# Patient Record
Sex: Male | Born: 1950 | Race: White | Hispanic: No | Marital: Married | Smoking: Never smoker
Health system: Southern US, Community
[De-identification: ages and names within clinical notes are randomized; demographics above are authoritative.]

## PROBLEM LIST (undated history)

## (undated) DIAGNOSIS — E119 Type 2 diabetes mellitus without complications: Secondary | ICD-10-CM

## (undated) HISTORY — PX: CORONARY ANGIOPLASTY: SHX604

## (undated) HISTORY — PX: COLONOSCOPY: SHX174

---

## 2008-12-30 ENCOUNTER — Ambulatory Visit: Payer: Self-pay | Admitting: Gastroenterology

## 2015-03-22 DIAGNOSIS — Z125 Encounter for screening for malignant neoplasm of prostate: Secondary | ICD-10-CM | POA: Diagnosis not present

## 2015-03-22 DIAGNOSIS — Z79899 Other long term (current) drug therapy: Secondary | ICD-10-CM | POA: Diagnosis not present

## 2015-03-22 DIAGNOSIS — I10 Essential (primary) hypertension: Secondary | ICD-10-CM | POA: Diagnosis not present

## 2015-03-22 DIAGNOSIS — E780 Pure hypercholesterolemia: Secondary | ICD-10-CM | POA: Diagnosis not present

## 2015-03-22 DIAGNOSIS — E119 Type 2 diabetes mellitus without complications: Secondary | ICD-10-CM | POA: Diagnosis not present

## 2015-03-29 DIAGNOSIS — E119 Type 2 diabetes mellitus without complications: Secondary | ICD-10-CM | POA: Diagnosis not present

## 2015-03-29 DIAGNOSIS — R609 Edema, unspecified: Secondary | ICD-10-CM | POA: Diagnosis not present

## 2015-03-29 DIAGNOSIS — Z0000 Encounter for general adult medical examination without abnormal findings: Secondary | ICD-10-CM | POA: Diagnosis not present

## 2015-03-29 DIAGNOSIS — E780 Pure hypercholesterolemia: Secondary | ICD-10-CM | POA: Diagnosis not present

## 2015-05-15 DIAGNOSIS — Z024 Encounter for examination for driving license: Secondary | ICD-10-CM | POA: Diagnosis not present

## 2015-09-21 DIAGNOSIS — Z79899 Other long term (current) drug therapy: Secondary | ICD-10-CM | POA: Diagnosis not present

## 2015-09-21 DIAGNOSIS — E7800 Pure hypercholesterolemia, unspecified: Secondary | ICD-10-CM | POA: Diagnosis not present

## 2015-09-21 DIAGNOSIS — E119 Type 2 diabetes mellitus without complications: Secondary | ICD-10-CM | POA: Diagnosis not present

## 2015-10-10 DIAGNOSIS — Z125 Encounter for screening for malignant neoplasm of prostate: Secondary | ICD-10-CM | POA: Diagnosis not present

## 2015-10-10 DIAGNOSIS — Z79899 Other long term (current) drug therapy: Secondary | ICD-10-CM | POA: Diagnosis not present

## 2015-10-10 DIAGNOSIS — E7800 Pure hypercholesterolemia, unspecified: Secondary | ICD-10-CM | POA: Diagnosis not present

## 2015-10-10 DIAGNOSIS — E119 Type 2 diabetes mellitus without complications: Secondary | ICD-10-CM | POA: Diagnosis not present

## 2016-03-29 DIAGNOSIS — Z125 Encounter for screening for malignant neoplasm of prostate: Secondary | ICD-10-CM | POA: Diagnosis not present

## 2016-03-29 DIAGNOSIS — E119 Type 2 diabetes mellitus without complications: Secondary | ICD-10-CM | POA: Diagnosis not present

## 2016-03-29 DIAGNOSIS — Z79899 Other long term (current) drug therapy: Secondary | ICD-10-CM | POA: Diagnosis not present

## 2016-03-29 DIAGNOSIS — E7800 Pure hypercholesterolemia, unspecified: Secondary | ICD-10-CM | POA: Diagnosis not present

## 2016-04-16 DIAGNOSIS — E1165 Type 2 diabetes mellitus with hyperglycemia: Secondary | ICD-10-CM | POA: Diagnosis not present

## 2016-04-16 DIAGNOSIS — Z79899 Other long term (current) drug therapy: Secondary | ICD-10-CM | POA: Diagnosis not present

## 2016-04-16 DIAGNOSIS — E7800 Pure hypercholesterolemia, unspecified: Secondary | ICD-10-CM | POA: Diagnosis not present

## 2016-04-16 DIAGNOSIS — E119 Type 2 diabetes mellitus without complications: Secondary | ICD-10-CM | POA: Diagnosis not present

## 2016-04-16 DIAGNOSIS — Z0000 Encounter for general adult medical examination without abnormal findings: Secondary | ICD-10-CM | POA: Diagnosis not present

## 2016-10-10 DIAGNOSIS — E7800 Pure hypercholesterolemia, unspecified: Secondary | ICD-10-CM | POA: Diagnosis not present

## 2016-10-10 DIAGNOSIS — Z79899 Other long term (current) drug therapy: Secondary | ICD-10-CM | POA: Diagnosis not present

## 2016-10-10 DIAGNOSIS — E119 Type 2 diabetes mellitus without complications: Secondary | ICD-10-CM | POA: Diagnosis not present

## 2016-10-23 DIAGNOSIS — J069 Acute upper respiratory infection, unspecified: Secondary | ICD-10-CM | POA: Diagnosis not present

## 2016-10-23 DIAGNOSIS — E119 Type 2 diabetes mellitus without complications: Secondary | ICD-10-CM | POA: Diagnosis not present

## 2016-10-23 DIAGNOSIS — E7800 Pure hypercholesterolemia, unspecified: Secondary | ICD-10-CM | POA: Diagnosis not present

## 2016-10-23 DIAGNOSIS — Z125 Encounter for screening for malignant neoplasm of prostate: Secondary | ICD-10-CM | POA: Diagnosis not present

## 2016-10-23 DIAGNOSIS — Z79899 Other long term (current) drug therapy: Secondary | ICD-10-CM | POA: Diagnosis not present

## 2017-04-16 DIAGNOSIS — E7800 Pure hypercholesterolemia, unspecified: Secondary | ICD-10-CM | POA: Diagnosis not present

## 2017-04-16 DIAGNOSIS — Z79899 Other long term (current) drug therapy: Secondary | ICD-10-CM | POA: Diagnosis not present

## 2017-04-16 DIAGNOSIS — Z125 Encounter for screening for malignant neoplasm of prostate: Secondary | ICD-10-CM | POA: Diagnosis not present

## 2017-04-16 DIAGNOSIS — E119 Type 2 diabetes mellitus without complications: Secondary | ICD-10-CM | POA: Diagnosis not present

## 2017-05-16 DIAGNOSIS — Z0000 Encounter for general adult medical examination without abnormal findings: Secondary | ICD-10-CM | POA: Diagnosis not present

## 2017-05-16 DIAGNOSIS — Z79899 Other long term (current) drug therapy: Secondary | ICD-10-CM | POA: Diagnosis not present

## 2017-05-16 DIAGNOSIS — E7800 Pure hypercholesterolemia, unspecified: Secondary | ICD-10-CM | POA: Diagnosis not present

## 2017-05-16 DIAGNOSIS — E1165 Type 2 diabetes mellitus with hyperglycemia: Secondary | ICD-10-CM | POA: Diagnosis not present

## 2017-05-16 DIAGNOSIS — E119 Type 2 diabetes mellitus without complications: Secondary | ICD-10-CM | POA: Diagnosis not present

## 2017-06-01 DIAGNOSIS — R079 Chest pain, unspecified: Secondary | ICD-10-CM | POA: Diagnosis not present

## 2017-06-03 DIAGNOSIS — R0789 Other chest pain: Secondary | ICD-10-CM | POA: Diagnosis not present

## 2017-06-03 DIAGNOSIS — K219 Gastro-esophageal reflux disease without esophagitis: Secondary | ICD-10-CM | POA: Diagnosis not present

## 2017-06-09 ENCOUNTER — Observation Stay
Admission: EM | Admit: 2017-06-09 | Discharge: 2017-06-10 | Disposition: A | Discharge status: Home / Self Care | Payer: Medicare HMO | Attending: Internal Medicine | Admitting: Internal Medicine

## 2017-06-09 ENCOUNTER — Emergency Department: Payer: Medicare HMO

## 2017-06-09 ENCOUNTER — Encounter: Payer: Self-pay | Admitting: Emergency Medicine

## 2017-06-09 ENCOUNTER — Encounter
Admission: EM | Disposition: A | Discharge status: Home / Self Care | Payer: Self-pay | Source: Home / Self Care | Attending: Emergency Medicine

## 2017-06-09 DIAGNOSIS — E119 Type 2 diabetes mellitus without complications: Secondary | ICD-10-CM | POA: Diagnosis not present

## 2017-06-09 DIAGNOSIS — R9439 Abnormal result of other cardiovascular function study: Secondary | ICD-10-CM | POA: Diagnosis not present

## 2017-06-09 DIAGNOSIS — I2510 Atherosclerotic heart disease of native coronary artery without angina pectoris: Principal | ICD-10-CM | POA: Insufficient documentation

## 2017-06-09 DIAGNOSIS — I25110 Atherosclerotic heart disease of native coronary artery with unstable angina pectoris: Secondary | ICD-10-CM | POA: Diagnosis not present

## 2017-06-09 DIAGNOSIS — Z7984 Long term (current) use of oral hypoglycemic drugs: Secondary | ICD-10-CM | POA: Diagnosis not present

## 2017-06-09 DIAGNOSIS — Z7982 Long term (current) use of aspirin: Secondary | ICD-10-CM | POA: Insufficient documentation

## 2017-06-09 DIAGNOSIS — Z79899 Other long term (current) drug therapy: Secondary | ICD-10-CM | POA: Insufficient documentation

## 2017-06-09 DIAGNOSIS — E7800 Pure hypercholesterolemia, unspecified: Secondary | ICD-10-CM | POA: Diagnosis not present

## 2017-06-09 DIAGNOSIS — R0789 Other chest pain: Secondary | ICD-10-CM

## 2017-06-09 DIAGNOSIS — R9430 Abnormal result of cardiovascular function study, unspecified: Secondary | ICD-10-CM | POA: Diagnosis not present

## 2017-06-09 DIAGNOSIS — R079 Chest pain, unspecified: Secondary | ICD-10-CM | POA: Diagnosis not present

## 2017-06-09 HISTORY — PX: CORONARY STENT INTERVENTION: CATH118234

## 2017-06-09 HISTORY — PX: LEFT HEART CATH AND CORONARY ANGIOGRAPHY: CATH118249

## 2017-06-09 HISTORY — DX: Type 2 diabetes mellitus without complications: E11.9

## 2017-06-09 LAB — CBC
HCT: 47.9 % (ref 40.0–52.0)
Hemoglobin: 16.6 g/dL (ref 13.0–18.0)
MCH: 30 pg (ref 26.0–34.0)
MCHC: 34.7 g/dL (ref 32.0–36.0)
MCV: 86.3 fL (ref 80.0–100.0)
PLATELETS: 262 10*3/uL (ref 150–440)
RBC: 5.55 MIL/uL (ref 4.40–5.90)
RDW: 12.7 % (ref 11.5–14.5)
WBC: 9.2 10*3/uL (ref 3.8–10.6)

## 2017-06-09 LAB — COMPREHENSIVE METABOLIC PANEL
ALBUMIN: 4.5 g/dL (ref 3.5–5.0)
ALT: 17 U/L (ref 17–63)
AST: 20 U/L (ref 15–41)
Alkaline Phosphatase: 72 U/L (ref 38–126)
Anion gap: 10 (ref 5–15)
BUN: 17 mg/dL (ref 6–20)
CHLORIDE: 99 mmol/L — AB (ref 101–111)
CO2: 26 mmol/L (ref 22–32)
Calcium: 9.3 mg/dL (ref 8.9–10.3)
Creatinine, Ser: 0.73 mg/dL (ref 0.61–1.24)
GFR calc Af Amer: >60 mL/min (ref >60)
GLUCOSE: 156 mg/dL — AB (ref 65–99)
POTASSIUM: 4.3 mmol/L (ref 3.5–5.1)
Sodium: 135 mmol/L (ref 135–145)
Total Bilirubin: 1 mg/dL (ref 0.3–1.2)
Total Protein: 8.1 g/dL (ref 6.5–8.1)

## 2017-06-09 LAB — PROTIME-INR
INR: 1.04
Prothrombin Time: 13.6 seconds (ref 11.4–15.2)

## 2017-06-09 LAB — APTT: aPTT: 27 seconds (ref 24–36)

## 2017-06-09 LAB — TROPONIN I

## 2017-06-09 LAB — GLUCOSE, CAPILLARY
Glucose-Capillary: 122 mg/dL — ABNORMAL HIGH (ref 65–99)
Glucose-Capillary: 135 mg/dL — ABNORMAL HIGH (ref 65–99)

## 2017-06-09 LAB — POCT ACTIVATED CLOTTING TIME: Activated Clotting Time: 324 seconds

## 2017-06-09 SURGERY — LEFT HEART CATH AND CORONARY ANGIOGRAPHY
Anesthesia: Moderate Sedation

## 2017-06-09 MED ORDER — SODIUM CHLORIDE 0.9% FLUSH: 3.0000 mL | INTRAVENOUS | Status: DC | PRN

## 2017-06-09 MED ORDER — BIVALIRUDIN TRIFLUOROACETATE 250 MG IV SOLR
INTRAVENOUS | Status: AC
  Filled 2017-06-09: qty 250

## 2017-06-09 MED ORDER — ENOXAPARIN SODIUM 40 MG/0.4ML ~~LOC~~ SOLN
40.0000 mg | SUBCUTANEOUS | Status: DC
  Filled 2017-06-09: qty 0.4

## 2017-06-09 MED ORDER — HYDROCODONE-ACETAMINOPHEN 5-325 MG PO TABS: 1.0000 | ORAL_TABLET | ORAL | Status: DC | PRN

## 2017-06-09 MED ORDER — ASPIRIN 81 MG PO CHEW
CHEWABLE_TABLET | ORAL | Status: DC | PRN
  Administered 2017-06-09: 243 mg via ORAL

## 2017-06-09 MED ORDER — HYDRALAZINE HCL 20 MG/ML IJ SOLN: 5.0000 mg | INTRAMUSCULAR | Status: AC | PRN

## 2017-06-09 MED ORDER — SODIUM CHLORIDE 0.9 % WEIGHT BASED INFUSION
1.0000 mL/kg/h | INTRAVENOUS | Status: AC
  Administered 2017-06-09: 1 mL/kg/h via INTRAVENOUS

## 2017-06-09 MED ORDER — KETOROLAC TROMETHAMINE 15 MG/ML IJ SOLN: 15.0000 mg | Freq: Four times a day (QID) | INTRAMUSCULAR | Status: DC | PRN

## 2017-06-09 MED ORDER — PANTOPRAZOLE SODIUM 40 MG PO TBEC
40.0000 mg | DELAYED_RELEASE_TABLET | Freq: Every day | ORAL | Status: DC
  Administered 2017-06-10: 40 mg via ORAL
  Filled 2017-06-09: qty 1

## 2017-06-09 MED ORDER — SODIUM CHLORIDE 0.9 % WEIGHT BASED INFUSION: 3.0000 mL/kg/h | INTRAVENOUS | Status: DC

## 2017-06-09 MED ORDER — SODIUM CHLORIDE 0.9 % IV SOLN: 250.0000 mL | INTRAVENOUS | Status: DC | PRN

## 2017-06-09 MED ORDER — SODIUM CHLORIDE 0.9% FLUSH: 3.0000 mL | Freq: Two times a day (BID) | INTRAVENOUS | Status: DC

## 2017-06-09 MED ORDER — LABETALOL HCL 5 MG/ML IV SOLN: 10.0000 mg | INTRAVENOUS | Status: AC | PRN

## 2017-06-09 MED ORDER — CLOPIDOGREL BISULFATE 75 MG PO TABS
ORAL_TABLET | ORAL | Status: DC | PRN
  Administered 2017-06-09: 600 mg via ORAL

## 2017-06-09 MED ORDER — ASPIRIN 81 MG PO CHEW
CHEWABLE_TABLET | ORAL | Status: AC
  Filled 2017-06-09: qty 3

## 2017-06-09 MED ORDER — INSULIN ASPART 100 UNIT/ML ~~LOC~~ SOLN
0.0000 [IU] | Freq: Three times a day (TID) | SUBCUTANEOUS | Status: DC
  Administered 2017-06-10: 2 [IU] via SUBCUTANEOUS
  Filled 2017-06-09: qty 1

## 2017-06-09 MED ORDER — ACETAMINOPHEN 325 MG PO TABS: 650.0000 mg | ORAL_TABLET | ORAL | Status: DC | PRN

## 2017-06-09 MED ORDER — IOPAMIDOL (ISOVUE-300) INJECTION 61%
INTRAVENOUS | Status: DC | PRN
  Administered 2017-06-09: 320 mL via INTRA_ARTERIAL

## 2017-06-09 MED ORDER — ACETAMINOPHEN 325 MG PO TABS
650.0000 mg | ORAL_TABLET | Freq: Four times a day (QID) | ORAL | Status: DC | PRN
Start: 2017-06-09 — End: 2017-06-10

## 2017-06-09 MED ORDER — BIVALIRUDIN BOLUS VIA INFUSION - CUPID
INTRAVENOUS | Status: DC | PRN
  Administered 2017-06-09: 73.8 mg via INTRAVENOUS

## 2017-06-09 MED ORDER — ALBUTEROL SULFATE (2.5 MG/3ML) 0.083% IN NEBU: 2.5000 mg | INHALATION_SOLUTION | RESPIRATORY_TRACT | Status: DC | PRN

## 2017-06-09 MED ORDER — ASPIRIN 81 MG PO CHEW
81.0000 mg | CHEWABLE_TABLET | ORAL | Status: AC
  Administered 2017-06-09: 81 mg via ORAL

## 2017-06-09 MED ORDER — ASPIRIN 81 MG PO CHEW
CHEWABLE_TABLET | ORAL | Status: AC
  Filled 2017-06-09: qty 1

## 2017-06-09 MED ORDER — INSULIN ASPART 100 UNIT/ML ~~LOC~~ SOLN: 0.0000 [IU] | Freq: Every day | SUBCUTANEOUS | Status: DC

## 2017-06-09 MED ORDER — ONDANSETRON HCL 4 MG/2ML IJ SOLN: 4.0000 mg | Freq: Four times a day (QID) | INTRAMUSCULAR | Status: DC | PRN

## 2017-06-09 MED ORDER — SODIUM CHLORIDE 0.9 % WEIGHT BASED INFUSION: 1.0000 mL/kg/h | INTRAVENOUS | Status: DC

## 2017-06-09 MED ORDER — FENTANYL CITRATE (PF) 100 MCG/2ML IJ SOLN
INTRAMUSCULAR | Status: DC | PRN
  Administered 2017-06-09: 25 ug via INTRAVENOUS

## 2017-06-09 MED ORDER — NITROGLYCERIN 5 MG/ML IV SOLN
INTRAVENOUS | Status: AC
  Filled 2017-06-09: qty 10

## 2017-06-09 MED ORDER — ACETAMINOPHEN 650 MG RE SUPP: 650.0000 mg | Freq: Four times a day (QID) | RECTAL | Status: DC | PRN

## 2017-06-09 MED ORDER — ONDANSETRON HCL 4 MG PO TABS: 4.0000 mg | ORAL_TABLET | Freq: Four times a day (QID) | ORAL | Status: DC | PRN

## 2017-06-09 MED ORDER — ASPIRIN 81 MG PO CHEW: 81.0000 mg | CHEWABLE_TABLET | Freq: Every day | ORAL | Status: DC

## 2017-06-09 MED ORDER — FENTANYL CITRATE (PF) 100 MCG/2ML IJ SOLN
INTRAMUSCULAR | Status: AC
  Filled 2017-06-09: qty 2

## 2017-06-09 MED ORDER — NITROGLYCERIN 1 MG/10 ML FOR IR/CATH LAB
INTRA_ARTERIAL | Status: DC | PRN
  Administered 2017-06-09: 200 ug via INTRACORONARY
  Administered 2017-06-09: 200 ug via INTRA_ARTERIAL
  Administered 2017-06-09: 200 ug via INTRACORONARY

## 2017-06-09 MED ORDER — SODIUM CHLORIDE 0.9 % IV SOLN
INTRAVENOUS | Status: AC | PRN
  Administered 2017-06-09 (×2): 1.75 mg/kg/h via INTRAVENOUS

## 2017-06-09 MED ORDER — MIDAZOLAM HCL 2 MG/2ML IJ SOLN
INTRAMUSCULAR | Status: AC
  Filled 2017-06-09: qty 2

## 2017-06-09 MED ORDER — CLOPIDOGREL BISULFATE 75 MG PO TABS
75.0000 mg | ORAL_TABLET | Freq: Every day | ORAL | Status: DC
  Administered 2017-06-10: 75 mg via ORAL
  Filled 2017-06-09: qty 1

## 2017-06-09 MED ORDER — SODIUM CHLORIDE 0.9% FLUSH
3.0000 mL | Freq: Two times a day (BID) | INTRAVENOUS | Status: DC
  Administered 2017-06-09: 3 mL via INTRAVENOUS

## 2017-06-09 MED ORDER — CLOPIDOGREL BISULFATE 300 MG PO TABS
ORAL_TABLET | ORAL | Status: AC
  Filled 2017-06-09: qty 2

## 2017-06-09 MED ORDER — ASPIRIN EC 81 MG PO TBEC
81.0000 mg | DELAYED_RELEASE_TABLET | Freq: Every day | ORAL | Status: DC
  Administered 2017-06-10: 81 mg via ORAL
  Filled 2017-06-09: qty 1

## 2017-06-09 MED ORDER — MIDAZOLAM HCL 2 MG/2ML IJ SOLN
INTRAMUSCULAR | Status: DC | PRN
  Administered 2017-06-09: 1 mg via INTRAVENOUS

## 2017-06-09 SURGICAL SUPPLY — 18 items
BALLN TREK RX 2.25X12 (BALLOONS) ×2
BALLOON TREK RX 2.25X12 (BALLOONS) ×1 IMPLANT
CATH INFINITI 5FR ANG PIGTAIL (CATHETERS) ×2 IMPLANT
CATH INFINITI 5FR JL4 (CATHETERS) ×2 IMPLANT
CATH INFINITI JR4 5F (CATHETERS) ×2 IMPLANT
CATH VISTA GUIDE 6FR JR4 (CATHETERS) ×2 IMPLANT
CATH VISTA GUIDE 6FR XB3.5 (CATHETERS) ×2 IMPLANT
DEVICE CLOSURE MYNXGRIP 6/7F (Vascular Products) ×2 IMPLANT
DEVICE INFLAT 30 PLUS (MISCELLANEOUS) ×2 IMPLANT
KIT MANI 3VAL PERCEP (MISCELLANEOUS) ×2 IMPLANT
NEEDLE PERC 18GX7CM (NEEDLE) ×2 IMPLANT
PACK CARDIAC CATH (CUSTOM PROCEDURE TRAY) ×2 IMPLANT
SHEATH AVANTI 5FR X 11CM (SHEATH) ×2 IMPLANT
SHEATH AVANTI 6FR X 11CM (SHEATH) ×2 IMPLANT
STENT RESOLUTE ONYX 2.75X12 (Permanent Stent) ×2 IMPLANT
STENT RESOLUTE ONYX 2.75X15 (Permanent Stent) ×2 IMPLANT
WIRE ASAHI PROWATER 180CM (WIRE) ×4 IMPLANT
WIRE EMERALD 3MM-J .035X150CM (WIRE) ×2 IMPLANT

## 2017-06-09 NOTE — ED Notes (Signed)
Per specials recovery bring him to room 3. Also no rush in bringing patient over.

## 2017-06-09 NOTE — Progress Notes (Signed)
Nurse notified  

## 2017-06-09 NOTE — Progress Notes (Signed)
Angiomax gtt.  OFF now; pt. Transferred to rm. 255 now with RN, on monitor. Right groin clean, dry, intact without hematoma, edema, drainage, ecchymosis. Pt. Without any cardiac or subjective c/o. Stable for Tx. Wife at bedside with pt.

## 2017-06-09 NOTE — H&P (Signed)
[facility] [place] NAME: Chris Harris    MR#:  [id number]  DATE OF BIRTH:  Jan 08, 1951  DATE OF ADMISSION:  06/09/2017  PRIMARY CARE PHYSICIAN: Idelle Crouch, MD   REQUESTING/REFERRING PHYSICIAN: Lavonia Drafts, MD  CHIEF COMPLAINT:   Chief Complaint  Patient presents with  . Chest Pain   Abnormal stress test today. HISTORY OF PRESENT ILLNESS:  Chris Harris  is a 66 y.o. male with a known history of Diabetes. He was sent by cardiologist due to abnormal stress test today. He denies any symptoms today. He said that he had 1 episode of burning chest pain radiation to jaw 1 week ago, which lasted about 15 minutes. His EKG was normal so he was scheduled to have a stress test today.  PAST MEDICAL HISTORY:   Past Medical History:  Diagnosis Date  . Diabetes mellitus without complication ([place])     PAST SURGICAL HISTORY:  History reviewed. No pertinent surgical history.  SOCIAL HISTORY:   Social History  Substance Use Topics  . Smoking status: Never Smoker  . Smokeless tobacco: Never Used  . Alcohol use Not on file    FAMILY HISTORY:  History reviewed. No pertinent family history. CAD. Diabetes, prostate cancer. DRUG ALLERGIES:  No Known Allergies  REVIEW OF SYSTEMS:   Review of Systems  Constitutional: Negative.   HENT: Negative.   Eyes: Negative for blurred vision and double vision.  Respiratory: Negative.   Cardiovascular: Negative.   Gastrointestinal: Negative.   Genitourinary: Negative.   Musculoskeletal: Negative.   Skin: Negative.   Neurological: Negative.   Endo/Heme/Allergies: Negative.   Psychiatric/Behavioral: Negative.     MEDICATIONS AT HOME:   Prior to Admission medications   Medication Sig Start Date End Date Taking? Authorizing Provider  aspirin EC 81 MG tablet Take 81 mg by mouth daily.   Yes [provider]  canagliflozin (INVOKANA) 300 MG TABS tablet Take 1 tablet by mouth  daily. 05/16/17  Yes [provider]  metFORMIN (GLUCOPHAGE) 1000 MG tablet Take 1 tablet by mouth daily. 06/02/17  Yes [provider]  pantoprazole (PROTONIX) 40 MG tablet Take 40 mg by mouth 2 (two) times daily as needed.   Yes [provider]      VITAL SIGNS:  Blood pressure (!) 158/79, pulse 73, temperature 98.2 F (36.8 C), temperature source Oral, resp. rate 20, height 5\' 11"  (1.803 m), weight 217 lb (98.4 kg), SpO2 98 %.  PHYSICAL EXAMINATION:  Physical Exam  GENERAL:  66 y.o.-year-old patient lying in the bed with no acute distress.  EYES: Pupils equal, round, reactive to light and accommodation. No scleral icterus. Extraocular muscles intact.  HEENT: Head atraumatic, normocephalic. Oropharynx and nasopharynx clear.  NECK:  Supple, no jugular venous distention. No thyroid enlargement, no tenderness.  LUNGS: Normal breath sounds bilaterally, no wheezing, rales,rhonchi or crepitation. No use of accessory muscles of respiration.  CARDIOVASCULAR: S1, S2 normal. No murmurs, rubs, or gallops.  ABDOMEN: Soft, nontender, nondistended. Bowel sounds present. No organomegaly or mass.  EXTREMITIES: No pedal edema, cyanosis, or clubbing.  NEUROLOGIC: Cranial nerves II through XII are intact. Muscle strength 5/5 in all extremities. Sensation intact. Gait not checked.  PSYCHIATRIC: The patient is alert and oriented x 3.  SKIN: No obvious rash, lesion, or ulcer.   LABORATORY PANEL:   CBC  Recent Labs Lab 06/09/17 1241  WBC 9.2  HGB 16.6  HCT 47.9  PLT 262   ------------------------------------------------------------------------------------------------------------------  Chemistries   Recent Labs Lab 06/09/17 1241  NA 135  K 4.3  CL 99*  CO2 26  GLUCOSE 156*  BUN 17  CREATININE 0.73  CALCIUM 9.3  AST 20  ALT 17  ALKPHOS 72  BILITOT 1.0    ------------------------------------------------------------------------------------------------------------------  Cardiac Enzymes  Recent Labs Lab 06/09/17 1241  TROPONINI <0.03   ------------------------------------------------------------------------------------------------------------------  RADIOLOGY:  Dg Chest Portable 1 View  Result Date: 06/09/2017 CLINICAL DATA:  Chest and mandible region pain EXAM: PORTABLE CHEST 1 VIEW COMPARISON:  None. FINDINGS: Lungs are clear. Heart is upper normal in size with pulmonary vascularity within normal limits. No adenopathy. There is aortic atherosclerosis. There is degenerative change in the thoracic spine. IMPRESSION: No edema or consolidation.  There is aortic atherosclerosis. Aortic Atherosclerosis (ICD10-I70.0). Electronically Signed   By: Lowella Grip III M.D.   On: 06/09/2017 13:23      IMPRESSION AND PLAN:   Abnormal stress test The patient is placed for observation. The patient will be transferred to catheter lab to have cardiac cath today. Start aspirin.  Diabetes. Hold by mouth diabetes medication, start sliding scale. Check hemoglobin A1c.  All the records are reviewed and case discussed with ED provider. Management plans discussed with the patient, His wife and they are in agreement.  CODE STATUS: Full code  TOTAL TIME TAKING CARE OF THIS PATIENT: 48 minutes.    Demetrios Loll M.D on 06/09/2017 at 2:27 PM  Between 7am to 6pm - Pager - [phone]  After 6pm go to [email or web address] - Proofreader  [facility]  Office  [phone]  CC: Primary care physician; Idelle Crouch, MD   Note: This dictation was prepared with Dragon dictation along with smaller phrase technology. Any transcriptional errors that result from this process are unintentional.

## 2017-06-09 NOTE — ED Triage Notes (Signed)
Patient had treadmill stress test today and was sent over by Cardiology for further evaluation.

## 2017-06-09 NOTE — ED Provider Notes (Signed)
[facility] Emergency Department Provider Note   ____________________________________________    I have reviewed the triage vital signs and the nursing notes.   HISTORY  Chief Complaint Chest Pain     HPI Chris Harris is a 66 y.o. male with a history of diabetes who presents after an abnormal stress test. Patient reports over the last month and a half later he exerts himself he feels a tingling in his jaw and neck. He feels mildly short of breath with exertion. He denies fevers or chills. Denies chest pain. His PCP ordered a stress test for him which was done this morning which was noted to be abnormal by cardiology, Dr. Saralyn Pilar recommended he come to the ED for admission and cardiac cath. Patient feels well at this time. No lower extremity swelling or pain   Past Medical History:  Diagnosis Date  . Diabetes mellitus without complication ([place])     There are no active problems to display for this patient.   No past surgical history on file.  Prior to Admission medications   Medication Sig Start Date End Date Taking? Authorizing Provider  canagliflozin (INVOKANA) 300 MG TABS tablet Take 1 tablet by mouth daily. 05/16/17  Yes [provider]  aspirin EC 81 MG tablet Take 81 mg by mouth daily.    [provider]  metFORMIN (GLUCOPHAGE) 1000 MG tablet Take 1 tablet by mouth daily. 06/02/17   [provider]  pantoprazole (PROTONIX) 40 MG tablet Take 1 tablet by mouth 2 (two) times daily. 06/03/17   [provider]     Allergies Patient has no known allergies.  No family history on file.  Social History Social History  Substance Use Topics  . Smoking status: Never Smoker  . Smokeless tobacco: Never Used  . Alcohol use Not on file    Review of Systems  Constitutional: No fever/chills Eyes: No visual changes.  ENT: No sore throat. Cardiovascular: Denies chest pain. Respiratory: Denies shortness of  breath. Gastrointestinal: No abdominal pain.    Genitourinary: Negative for dysuria. Musculoskeletal: Negative for back pain. Skin: Negative for rash. Neurological: Negative for headaches or weakness   ____________________________________________   PHYSICAL EXAM:  VITAL SIGNS: ED Triage Vitals [06/09/17 1242]  Enc Vitals Group     BP 139/63     Pulse Rate 64     Resp 16     Temp 98.2 F (36.8 C)     Temp src      SpO2 98 %     Weight 98.4 kg (217 lb)     Height 1.803 m (5\' 11" )     Head Circumference      Peak Flow      Pain Score 0     Pain Loc      Pain Edu?      Excl. in [place]?     Constitutional: Alert and oriented. No acute distress. Pleasant and interactive Eyes: Conjunctivae are normal.   Nose: No congestion/rhinnorhea. Mouth/Throat: Mucous membranes are moist.    Cardiovascular: Normal rate, regular rhythm. Grossly normal heart sounds.  Good peripheral circulation. Respiratory: Normal respiratory effort.  No retractions. Lungs CTAB. Gastrointestinal: Soft and nontender. No distention.  No CVA tenderness. Genitourinary: deferred Musculoskeletal: No lower extremity tenderness nor edema.  Warm and well perfused Neurologic:  Normal speech and language. No gross focal neurologic deficits are appreciated.  Skin:  Skin is warm, dry and intact. No rash noted. Psychiatric: Mood and affect are  normal. Speech and behavior are normal.  ____________________________________________   LABS (all labs ordered are listed, but only abnormal results are displayed)  Labs Reviewed  CBC  APTT  PROTIME-INR  COMPREHENSIVE METABOLIC PANEL  TROPONIN I   ____________________________________________  EKG  ED ECG REPORT I, Lavonia Drafts, the attending physician, personally viewed and interpreted this ECG.  Date: 06/09/2017  Rhythm: normal sinus rhythm QRS Axis: normal Intervals: normal ST/T Wave abnormalities:  normal   ____________________________________________  RADIOLOGY  Chest x-ray ____________________________________________   PROCEDURES  Procedure(s) performed: No    Critical Care performed: No ____________________________________________   INITIAL IMPRESSION / ASSESSMENT AND PLAN / ED COURSE  Pertinent labs & imaging results that were available during my care of the patient were reviewed by me and considered in my medical decision making (see chart for details).  Patient well-appearing and in no acute distress. EKG is unremarkable at this time. Discussed with hospitalist for admission, patient for cardiac cath this afternoon    ____________________________________________   FINAL CLINICAL IMPRESSION(S) / ED DIAGNOSES  Final diagnoses:  Atypical chest pain      NEW MEDICATIONS STARTED DURING THIS VISIT:  New Prescriptions   No medications on file     Note:  This document was prepared using Dragon voice recognition software and may include unintentional dictation errors.    Lavonia Drafts, MD 06/09/17 1324

## 2017-06-09 NOTE — Progress Notes (Signed)
Report called to Janett Billow, RN of 255. Right groin clean, dry, intact without hematoma, edema, ecchymosis, drainage. No c/o chest pain, SOB, dizziness, HA, groin pain.

## 2017-06-09 NOTE — ED Notes (Signed)
Patient states that when he exerts himself he gets a tingling sensation in his jaw. Patient denies any chest pain, shob, arm/back pain.

## 2017-06-10 ENCOUNTER — Encounter: Payer: Self-pay | Admitting: Cardiology

## 2017-06-10 DIAGNOSIS — E119 Type 2 diabetes mellitus without complications: Secondary | ICD-10-CM | POA: Diagnosis not present

## 2017-06-10 DIAGNOSIS — R9439 Abnormal result of other cardiovascular function study: Secondary | ICD-10-CM | POA: Diagnosis not present

## 2017-06-10 DIAGNOSIS — I25110 Atherosclerotic heart disease of native coronary artery with unstable angina pectoris: Secondary | ICD-10-CM | POA: Diagnosis not present

## 2017-06-10 LAB — BASIC METABOLIC PANEL
Anion gap: 6 (ref 5–15)
BUN: 14 mg/dL (ref 6–20)
CALCIUM: 8.8 mg/dL — AB (ref 8.9–10.3)
CO2: 27 mmol/L (ref 22–32)
CREATININE: 0.77 mg/dL (ref 0.61–1.24)
Chloride: 103 mmol/L (ref 101–111)
GFR calc Af Amer: >60 mL/min (ref >60)
GLUCOSE: 174 mg/dL — AB (ref 65–99)
POTASSIUM: 4.2 mmol/L (ref 3.5–5.1)
Sodium: 136 mmol/L (ref 135–145)

## 2017-06-10 LAB — CBC
HEMATOCRIT: 45.9 % (ref 40.0–52.0)
Hemoglobin: 16.1 g/dL (ref 13.0–18.0)
MCH: 30 pg (ref 26.0–34.0)
MCHC: 35 g/dL (ref 32.0–36.0)
MCV: 85.9 fL (ref 80.0–100.0)
PLATELETS: 236 10*3/uL (ref 150–440)
RBC: 5.35 MIL/uL (ref 4.40–5.90)
RDW: 12.6 % (ref 11.5–14.5)
WBC: 10.3 10*3/uL (ref 3.8–10.6)

## 2017-06-10 LAB — HEMOGLOBIN A1C
Hgb A1c MFr Bld: 7.5 % — ABNORMAL HIGH (ref 4.8–5.6)
MEAN PLASMA GLUCOSE: 169 mg/dL

## 2017-06-10 LAB — GLUCOSE, CAPILLARY: Glucose-Capillary: 156 mg/dL — ABNORMAL HIGH (ref 65–99)

## 2017-06-10 MED ORDER — CLOPIDOGREL BISULFATE 75 MG PO TABS: 75.0000 mg | ORAL_TABLET | Freq: Every day | ORAL | 0 refills | Status: AC

## 2017-06-10 MED ORDER — NITROGLYCERIN 0.4 MG SL SUBL: 0.4000 mg | SUBLINGUAL_TABLET | SUBLINGUAL | 0 refills | Status: AC | PRN

## 2017-06-10 MED ORDER — ATORVASTATIN CALCIUM 40 MG PO TABS: 40.0000 mg | ORAL_TABLET | Freq: Every day | ORAL | 0 refills | Status: AC

## 2017-06-10 MED ORDER — ATORVASTATIN CALCIUM 20 MG PO TABS: 40.0000 mg | ORAL_TABLET | Freq: Every day | ORAL | Status: DC

## 2017-06-10 NOTE — Progress Notes (Signed)
Pt discharged to home via wc.  Instructions  given to pt.  Questions answered.  No distress.  

## 2017-06-10 NOTE — Discharge Instructions (Signed)
Heart healthy diet

## 2017-06-10 NOTE — Progress Notes (Signed)
[facility] Cardiology  SUBJECTIVE: I don't have chest pain   Vitals:   06/09/17 1825 06/09/17 1948 06/10/17 0518 06/10/17 0732  BP: (!) 142/55 (!) 149/57 (!) 148/64 130/62  Pulse: 62 (!) 54 (!) 59 (!) 57  Resp: 20 18 16 15   Temp: 98.2 F (36.8 C) 98.2 F (36.8 C) 98.3 F (36.8 C) 98 F (36.7 C)  TempSrc: Oral Oral Oral Oral  SpO2: 98% 99% 97% 98%  Weight: 97.6 kg (215 lb 3.2 oz)     Height:         Intake/Output Summary (Last 24 hours) at 06/10/17 0800 Last data filed at 06/10/17 3818  Gross per 24 hour  Intake           608.72 ml  Output             1975 ml  Net         -1366.28 ml      PHYSICAL EXAM  General: Well developed, well nourished, in no acute distress HEENT:  Normocephalic and atramatic Neck:  No JVD.  Lungs: Clear bilaterally to auscultation and percussion. Heart: HRRR . Normal S1 and S2 without gallops or murmurs.  Abdomen: Bowel sounds are positive, abdomen soft and non-tender  Msk:  Back normal, normal gait. Normal strength and tone for age. Extremities: No clubbing, cyanosis or edema.   Neuro: Alert and oriented X 3. Psych:  Good affect, responds appropriately   LABS: Basic Metabolic Panel:  Recent Labs  06/09/17 1241 06/10/17 0536  NA 135 136  K 4.3 4.2  CL 99* 103  CO2 26 27  GLUCOSE 156* 174*  BUN 17 14  CREATININE 0.73 0.77  CALCIUM 9.3 8.8*   Liver Function Tests:  Recent Labs  06/09/17 1241  AST 20  ALT 17  ALKPHOS 72  BILITOT 1.0  PROT 8.1  ALBUMIN 4.5   No results for input(s): LIPASE, AMYLASE in the last 72 hours. CBC:  Recent Labs  06/09/17 1241 06/10/17 0536  WBC 9.2 10.3  HGB 16.6 16.1  HCT 47.9 45.9  MCV 86.3 85.9  PLT 262 236   Cardiac Enzymes:  Recent Labs  06/09/17 1241  TROPONINI <0.03   BNP: Invalid input(s): POCBNP D-Dimer: No results for input(s): DDIMER in the last 72 hours. Hemoglobin A1C:  Recent Labs  06/09/17 1241  HGBA1C 7.5*   Fasting Lipid Panel: No results for input(s):  CHOL, HDL, LDLCALC, TRIG, CHOLHDL, LDLDIRECT in the last 72 hours. Thyroid Function Tests: No results for input(s): TSH, T4TOTAL, T3FREE, THYROIDAB in the last 72 hours.  Invalid input(s): FREET3 Anemia Panel: No results for input(s): VITAMINB12, FOLATE, FERRITIN, TIBC, IRON, RETICCTPCT in the last 72 hours.  Dg Chest Portable 1 View  Result Date: 06/09/2017 CLINICAL DATA:  Chest and mandible region pain EXAM: PORTABLE CHEST 1 VIEW COMPARISON:  None. FINDINGS: Lungs are clear. Heart is upper normal in size with pulmonary vascularity within normal limits. No adenopathy. There is aortic atherosclerosis. There is degenerative change in the thoracic spine. IMPRESSION: No edema or consolidation.  There is aortic atherosclerosis. Aortic Atherosclerosis (ICD10-I70.0). Electronically Signed   By: Lowella Grip III M.D.   On: 06/09/2017 13:23     Echo   TELEMETRY: Normal sinus rhythm:  ASSESSMENT AND PLAN:  Active Problems:   Abnormal stress test    1. Status post DES proximal LAD and mid RCA  Recommendations  1. Dual antiplatelet therapy uninterrupted for 1 year 2. May DC home, follow-up in one week  Isaias Cowman, MD, PhD, [facility] 06/10/2017 8:00 AM

## 2017-06-10 NOTE — Plan of Care (Signed)
Problem: Activity: Goal: Ability to return to baseline activity level will improve Outcome: Progressing Referral to rehab  Problem: Cardiovascular: Goal: Ability to achieve and maintain adequate cardiovascular perfusion will improve Outcome: Progressing Rt groin site clean dry and intact  Problem: Safety: Goal: Ability to remain free from injury will improve Outcome: Progressing Non skid socks when oob

## 2017-06-10 NOTE — Care Management Obs Status (Signed)
[place] NOTIFICATION   Patient Details  Name: Chris Harris MRN: [id number] Date of Birth: [birth year]   Medicare Observation Status Notification Given:  Yes    Marshell Garfinkel, RN 06/10/2017, 10:48 AM

## 2017-06-12 NOTE — Discharge Summary (Signed)
Chris Harris NAME: Chris Harris    MR#:  [id number]  DATE OF BIRTH:  1951/08/15  DATE OF ADMISSION:  06/09/2017 ADMITTING PHYSICIAN: Demetrios Loll, MD  DATE OF DISCHARGE: 06/10/2017 12:14 PM  PRIMARY CARE PHYSICIAN: Idelle Crouch, MD   ADMISSION DIAGNOSIS:  Atypical chest pain [R07.89]  DISCHARGE DIAGNOSIS:  Active Problems:   Abnormal stress test   SECONDARY DIAGNOSIS:   Past Medical History:  Diagnosis Date  . Diabetes mellitus without complication ([place])      ADMITTING HISTORY  HISTORY OF PRESENT ILLNESS:  Chris Harris  is a 66 y.o. male with a known history of Diabetes. He was sent by cardiologist due to abnormal stress test today. He denies any symptoms today. He said that he had 1 episode of burning chest pain radiation to jaw 1 week ago, which lasted about 15 minutes. His EKG was normal so he was scheduled to have a stress test today.   HOSPITAL COURSE:   * CP with abnormal stress test Patient admitted to telemetry floor. Cardiac cathetrization with Status post DES proximal LAD and mid RCA. Started on ASA, Plavix, Statin. Baseline bradycardia and NO BB.  No further chest pain and ambulated in the hallway without issues.  Discharged home after seen by cardiology and cleared.  CONSULTS OBTAINED:  Treatment Team:  Isaias Cowman, MD  DRUG ALLERGIES:  No Known Allergies  DISCHARGE MEDICATIONS:   Discharge Medication List as of 06/10/2017 11:24 AM    START taking these medications   Details  atorvastatin (LIPITOR) 40 MG tablet Take 1 tablet (40 mg total) by mouth daily at 6 PM., Starting Tue 06/10/2017, Normal    clopidogrel (PLAVIX) 75 MG tablet Take 1 tablet (75 mg total) by mouth daily with breakfast., Starting Wed 06/11/2017, Normal    nitroGLYCERIN (NITROSTAT) 0.4 MG SL tablet Place 1 tablet (0.4 mg total) under the tongue every 5 (five) minutes as needed for chest pain., Starting Tue 06/10/2017, Until  Wed 06/10/2018, Normal      CONTINUE these medications which have NOT CHANGED   Details  aspirin EC 81 MG tablet Take 81 mg by mouth daily., Historical Med    canagliflozin (INVOKANA) 300 MG TABS tablet Take 1 tablet by mouth daily., Starting Fri 05/16/2017, Historical Med    metFORMIN (GLUCOPHAGE) 1000 MG tablet Take 1 tablet by mouth daily., Starting Mon 06/02/2017, Historical Med    pantoprazole (PROTONIX) 40 MG tablet Take 40 mg by mouth 2 (two) times daily as needed., Historical Med        Today   VITAL SIGNS:  Blood pressure 130/62, pulse (!) 57, temperature 98 F (36.7 C), temperature source Oral, resp. rate 15, height 5\' 11"  (1.803 m), weight 97.6 kg (215 lb 3.2 oz), SpO2 98 %.  I/O:  No intake or output data in the 24 hours ending 06/12/17 1226  PHYSICAL EXAMINATION:  Physical Exam  GENERAL:  66 y.o.-year-old patient lying in the bed with no acute distress.  LUNGS: Normal breath sounds bilaterally, no wheezing, rales,rhonchi or crepitation. No use of accessory muscles of respiration.  CARDIOVASCULAR: S1, S2 normal. No murmurs, rubs, or gallops.  PSYCHIATRIC: The patient is alert and oriented x 3.   DATA REVIEW:   CBC  Recent Labs Lab 06/10/17 0536  WBC 10.3  HGB 16.1  HCT 45.9  PLT 236    Chemistries   Recent Labs Lab 06/09/17 1241 06/10/17 0536  NA 135 136  K  4.3 4.2  CL 99* 103  CO2 26 27  GLUCOSE 156* 174*  BUN 17 14  CREATININE 0.73 0.77  CALCIUM 9.3 8.8*  AST 20  --   ALT 17  --   ALKPHOS 72  --   BILITOT 1.0  --     Cardiac Enzymes  Recent Labs Lab 06/09/17 1241  TROPONINI <0.03    Microbiology Results  No results found for this or any previous visit.  RADIOLOGY:  No results found.  Follow up with PCP in 1 week.  Management plans discussed with the patient, family and they are in agreement.  CODE STATUS:  Code Status History    Date Active Date Inactive Code Status Order ID Comments User Context   06/09/2017  6:22 PM  06/10/2017  3:15 PM Full Code [id number]  Demetrios Loll, MD Inpatient   06/09/2017  4:35 PM 06/09/2017  6:22 PM Full Code [id number]  Isaias Cowman, MD Inpatient      TOTAL TIME TAKING CARE OF THIS PATIENT ON DAY OF DISCHARGE: more than 30 minutes.   Hillary Bow R M.D on 06/12/2017 at 12:26 PM  Between 7am to 6pm - Pager - [phone]  After 6pm go to [email or web address] - password [email or web address] [place] [facility]  Office  [phone]  CC: Primary care physician; Idelle Crouch, MD  Note: This dictation was prepared with Dragon dictation along with smaller phrase technology. Any transcriptional errors that result from this process are unintentional.

## 2017-06-25 DIAGNOSIS — E119 Type 2 diabetes mellitus without complications: Secondary | ICD-10-CM | POA: Diagnosis not present

## 2017-06-25 DIAGNOSIS — I10 Essential (primary) hypertension: Secondary | ICD-10-CM | POA: Diagnosis not present

## 2017-06-25 DIAGNOSIS — Z9889 Other specified postprocedural states: Secondary | ICD-10-CM | POA: Diagnosis not present

## 2017-06-25 DIAGNOSIS — E785 Hyperlipidemia, unspecified: Secondary | ICD-10-CM | POA: Diagnosis not present

## 2017-09-22 DIAGNOSIS — E785 Hyperlipidemia, unspecified: Secondary | ICD-10-CM | POA: Diagnosis not present

## 2017-09-22 DIAGNOSIS — Z9889 Other specified postprocedural states: Secondary | ICD-10-CM | POA: Diagnosis not present

## 2017-09-22 DIAGNOSIS — I10 Essential (primary) hypertension: Secondary | ICD-10-CM | POA: Diagnosis not present

## 2017-11-12 IMAGING — DX DG CHEST 1V PORT
1 series · 1 of 1 positions shown · non-contrast
Comparison: None.

CLINICAL DATA: Chest and mandible region pain

EXAM:
PORTABLE CHEST 1 VIEW

[chest ap]
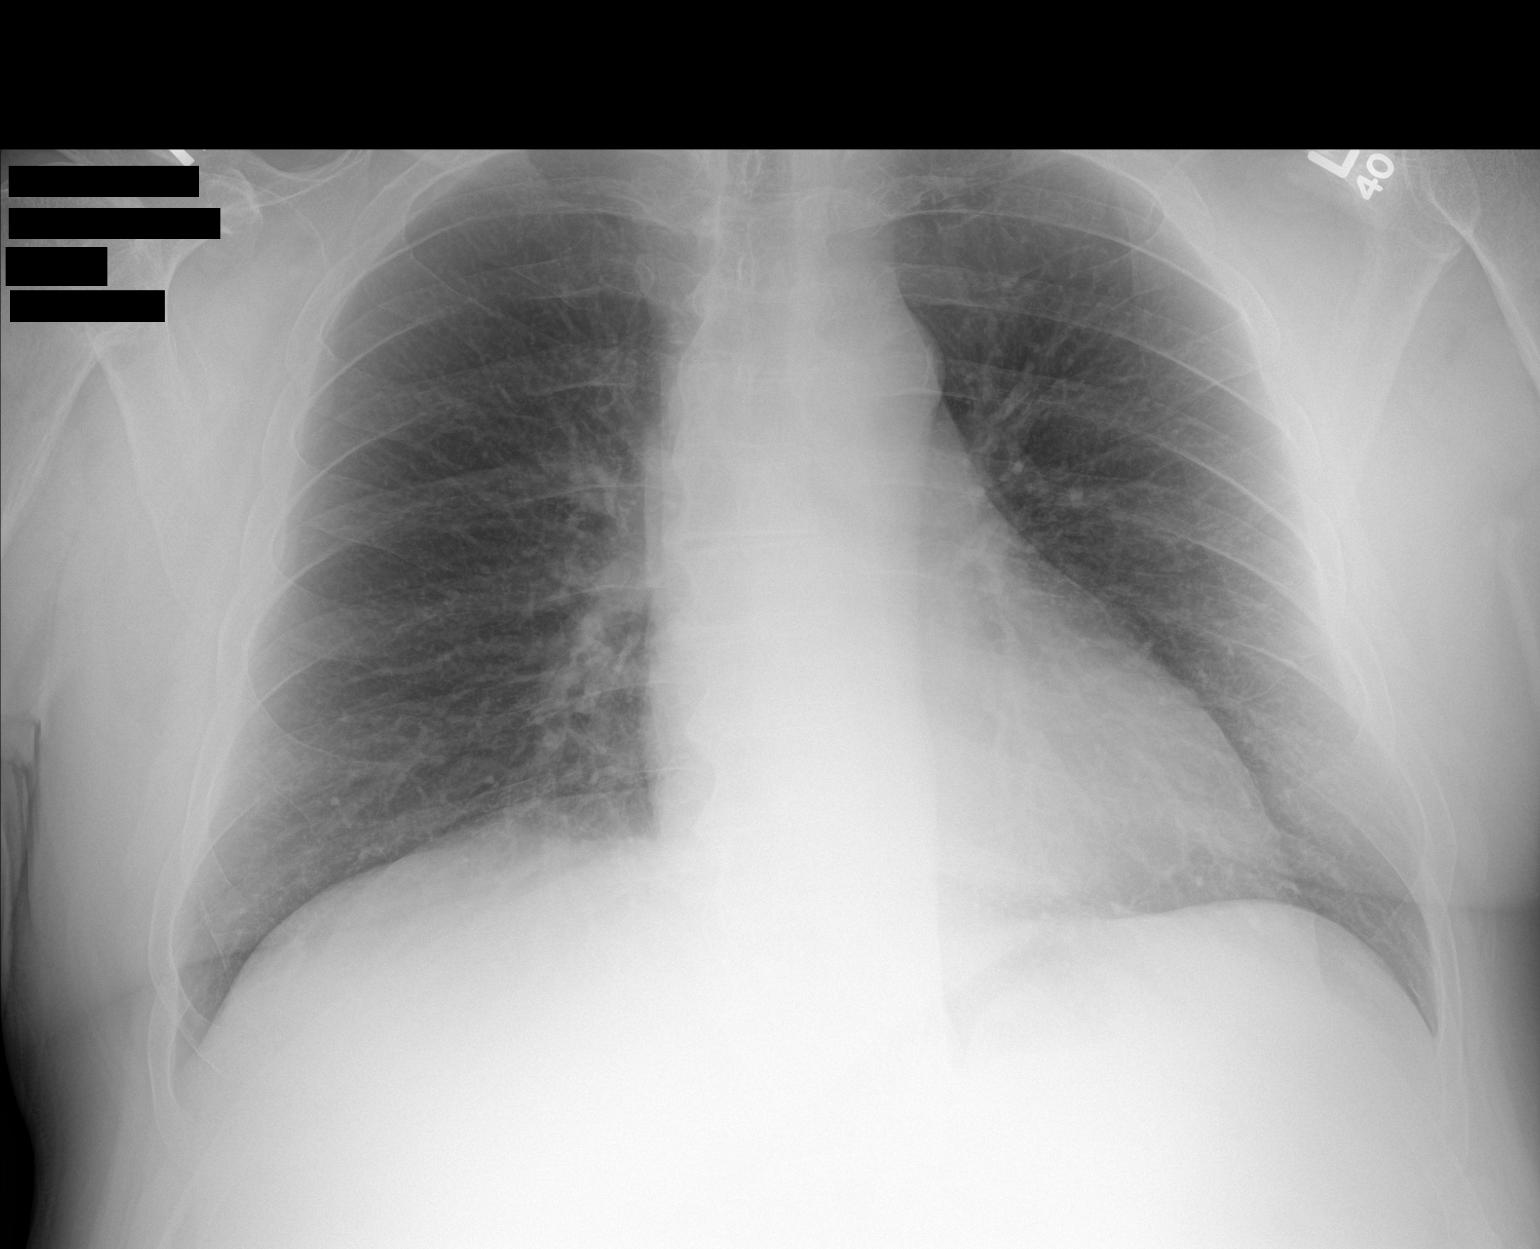

[1 of 1 positions shown; findings below may reference images not displayed]

FINDINGS: Lungs are clear. Heart is upper normal in size with pulmonary
vascularity within normal limits. No adenopathy. There is aortic
atherosclerosis. There is degenerative change in the thoracic spine.
IMPRESSION: No edema or consolidation.  There is aortic atherosclerosis.

Aortic Atherosclerosis ([id number]).

## 2017-11-14 DIAGNOSIS — E7800 Pure hypercholesterolemia, unspecified: Secondary | ICD-10-CM | POA: Diagnosis not present

## 2017-11-14 DIAGNOSIS — Z79899 Other long term (current) drug therapy: Secondary | ICD-10-CM | POA: Diagnosis not present

## 2017-11-14 DIAGNOSIS — E119 Type 2 diabetes mellitus without complications: Secondary | ICD-10-CM | POA: Diagnosis not present

## 2017-11-21 DIAGNOSIS — Z79899 Other long term (current) drug therapy: Secondary | ICD-10-CM | POA: Diagnosis not present

## 2017-11-21 DIAGNOSIS — E785 Hyperlipidemia, unspecified: Secondary | ICD-10-CM | POA: Diagnosis not present

## 2017-11-21 DIAGNOSIS — I10 Essential (primary) hypertension: Secondary | ICD-10-CM | POA: Diagnosis not present

## 2017-11-21 DIAGNOSIS — E119 Type 2 diabetes mellitus without complications: Secondary | ICD-10-CM | POA: Diagnosis not present

## 2018-01-26 DIAGNOSIS — Z9889 Other specified postprocedural states: Secondary | ICD-10-CM | POA: Diagnosis not present

## 2018-01-26 DIAGNOSIS — E785 Hyperlipidemia, unspecified: Secondary | ICD-10-CM | POA: Diagnosis not present

## 2018-01-26 DIAGNOSIS — I10 Essential (primary) hypertension: Secondary | ICD-10-CM | POA: Diagnosis not present

## 2018-01-26 DIAGNOSIS — R079 Chest pain, unspecified: Secondary | ICD-10-CM | POA: Diagnosis not present

## 2018-01-26 DIAGNOSIS — E119 Type 2 diabetes mellitus without complications: Secondary | ICD-10-CM | POA: Diagnosis not present

## 2018-02-18 DIAGNOSIS — E119 Type 2 diabetes mellitus without complications: Secondary | ICD-10-CM | POA: Diagnosis not present

## 2018-02-18 DIAGNOSIS — Z79899 Other long term (current) drug therapy: Secondary | ICD-10-CM | POA: Diagnosis not present

## 2018-02-25 DIAGNOSIS — Z79899 Other long term (current) drug therapy: Secondary | ICD-10-CM | POA: Diagnosis not present

## 2018-02-25 DIAGNOSIS — R609 Edema, unspecified: Secondary | ICD-10-CM | POA: Diagnosis not present

## 2018-02-25 DIAGNOSIS — E119 Type 2 diabetes mellitus without complications: Secondary | ICD-10-CM | POA: Diagnosis not present

## 2018-02-25 DIAGNOSIS — E785 Hyperlipidemia, unspecified: Secondary | ICD-10-CM | POA: Diagnosis not present

## 2018-02-25 DIAGNOSIS — Z0000 Encounter for general adult medical examination without abnormal findings: Secondary | ICD-10-CM | POA: Diagnosis not present

## 2018-02-25 DIAGNOSIS — I10 Essential (primary) hypertension: Secondary | ICD-10-CM | POA: Diagnosis not present

## 2018-02-25 DIAGNOSIS — Z125 Encounter for screening for malignant neoplasm of prostate: Secondary | ICD-10-CM | POA: Diagnosis not present

## 2018-06-29 DIAGNOSIS — Z125 Encounter for screening for malignant neoplasm of prostate: Secondary | ICD-10-CM | POA: Diagnosis not present

## 2018-06-29 DIAGNOSIS — I10 Essential (primary) hypertension: Secondary | ICD-10-CM | POA: Diagnosis not present

## 2018-06-29 DIAGNOSIS — E119 Type 2 diabetes mellitus without complications: Secondary | ICD-10-CM | POA: Diagnosis not present

## 2018-06-29 DIAGNOSIS — Z79899 Other long term (current) drug therapy: Secondary | ICD-10-CM | POA: Diagnosis not present

## 2018-06-29 DIAGNOSIS — E785 Hyperlipidemia, unspecified: Secondary | ICD-10-CM | POA: Diagnosis not present

## 2018-07-06 DIAGNOSIS — I10 Essential (primary) hypertension: Secondary | ICD-10-CM | POA: Diagnosis not present

## 2018-07-06 DIAGNOSIS — E785 Hyperlipidemia, unspecified: Secondary | ICD-10-CM | POA: Diagnosis not present

## 2018-07-06 DIAGNOSIS — Z0000 Encounter for general adult medical examination without abnormal findings: Secondary | ICD-10-CM | POA: Diagnosis not present

## 2018-07-06 DIAGNOSIS — E119 Type 2 diabetes mellitus without complications: Secondary | ICD-10-CM | POA: Diagnosis not present

## 2018-07-07 DIAGNOSIS — I10 Essential (primary) hypertension: Secondary | ICD-10-CM | POA: Diagnosis not present

## 2018-07-07 DIAGNOSIS — Z9889 Other specified postprocedural states: Secondary | ICD-10-CM | POA: Diagnosis not present

## 2018-07-07 DIAGNOSIS — E785 Hyperlipidemia, unspecified: Secondary | ICD-10-CM | POA: Diagnosis not present

## 2018-07-07 DIAGNOSIS — E119 Type 2 diabetes mellitus without complications: Secondary | ICD-10-CM | POA: Diagnosis not present

## 2018-10-15 DIAGNOSIS — E119 Type 2 diabetes mellitus without complications: Secondary | ICD-10-CM | POA: Diagnosis not present

## 2019-03-30 ENCOUNTER — Ambulatory Visit: Admission: RE | Admit: 2019-03-30 | Payer: Medicare HMO | Source: Home / Self Care | Admitting: Gastroenterology

## 2019-03-30 ENCOUNTER — Encounter: Admission: RE | Payer: Self-pay | Source: Home / Self Care

## 2019-03-30 SURGERY — COLONOSCOPY WITH PROPOFOL
Anesthesia: General

## 2019-06-22 DIAGNOSIS — I10 Essential (primary) hypertension: Secondary | ICD-10-CM | POA: Diagnosis not present

## 2019-07-02 DIAGNOSIS — Z125 Encounter for screening for malignant neoplasm of prostate: Secondary | ICD-10-CM | POA: Diagnosis not present

## 2019-07-02 DIAGNOSIS — E785 Hyperlipidemia, unspecified: Secondary | ICD-10-CM | POA: Diagnosis not present

## 2019-07-02 DIAGNOSIS — Z79899 Other long term (current) drug therapy: Secondary | ICD-10-CM | POA: Diagnosis not present

## 2019-07-02 DIAGNOSIS — E118 Type 2 diabetes mellitus with unspecified complications: Secondary | ICD-10-CM | POA: Diagnosis not present

## 2019-08-27 ENCOUNTER — Other Ambulatory Visit: Payer: Self-pay

## 2019-08-27 ENCOUNTER — Other Ambulatory Visit
Admission: RE | Admit: 2019-08-27 | Discharge: 2019-08-27 | Disposition: A | Discharge status: Home / Self Care | Payer: Medicare HMO | Source: Ambulatory Visit | Attending: Gastroenterology | Admitting: Gastroenterology

## 2019-08-27 DIAGNOSIS — Z20828 Contact with and (suspected) exposure to other viral communicable diseases: Secondary | ICD-10-CM | POA: Diagnosis not present

## 2019-08-27 DIAGNOSIS — Z01812 Encounter for preprocedural laboratory examination: Secondary | ICD-10-CM | POA: Diagnosis not present

## 2019-08-27 LAB — SARS CORONAVIRUS 2 (TAT 6-24 HRS): SARS Coronavirus 2: NEGATIVE

## 2019-08-31 ENCOUNTER — Ambulatory Visit: Payer: Medicare HMO | Admitting: Certified Registered Nurse Anesthetist

## 2019-08-31 ENCOUNTER — Encounter
Admission: RE | Disposition: A | Discharge status: Home / Self Care | Payer: Self-pay | Source: Home / Self Care | Attending: Gastroenterology

## 2019-08-31 ENCOUNTER — Ambulatory Visit
Admission: RE | Admit: 2019-08-31 | Discharge: 2019-08-31 | Disposition: A | Discharge status: Home / Self Care | Payer: Medicare HMO | Attending: Gastroenterology | Admitting: Gastroenterology

## 2019-08-31 ENCOUNTER — Other Ambulatory Visit: Payer: Self-pay

## 2019-08-31 ENCOUNTER — Encounter: Payer: Self-pay | Admitting: *Deleted

## 2019-08-31 DIAGNOSIS — Z7902 Long term (current) use of antithrombotics/antiplatelets: Secondary | ICD-10-CM | POA: Insufficient documentation

## 2019-08-31 DIAGNOSIS — K5730 Diverticulosis of large intestine without perforation or abscess without bleeding: Secondary | ICD-10-CM | POA: Diagnosis not present

## 2019-08-31 DIAGNOSIS — Z1211 Encounter for screening for malignant neoplasm of colon: Secondary | ICD-10-CM | POA: Insufficient documentation

## 2019-08-31 DIAGNOSIS — Z955 Presence of coronary angioplasty implant and graft: Secondary | ICD-10-CM | POA: Diagnosis not present

## 2019-08-31 DIAGNOSIS — Z7984 Long term (current) use of oral hypoglycemic drugs: Secondary | ICD-10-CM | POA: Insufficient documentation

## 2019-08-31 DIAGNOSIS — K635 Polyp of colon: Secondary | ICD-10-CM | POA: Insufficient documentation

## 2019-08-31 DIAGNOSIS — K5790 Diverticulosis of intestine, part unspecified, without perforation or abscess without bleeding: Secondary | ICD-10-CM | POA: Diagnosis not present

## 2019-08-31 DIAGNOSIS — D120 Benign neoplasm of cecum: Secondary | ICD-10-CM | POA: Diagnosis not present

## 2019-08-31 DIAGNOSIS — K640 First degree hemorrhoids: Secondary | ICD-10-CM | POA: Insufficient documentation

## 2019-08-31 DIAGNOSIS — Z7982 Long term (current) use of aspirin: Secondary | ICD-10-CM | POA: Diagnosis not present

## 2019-08-31 DIAGNOSIS — K648 Other hemorrhoids: Secondary | ICD-10-CM | POA: Diagnosis not present

## 2019-08-31 DIAGNOSIS — I2510 Atherosclerotic heart disease of native coronary artery without angina pectoris: Secondary | ICD-10-CM | POA: Insufficient documentation

## 2019-08-31 DIAGNOSIS — Z79899 Other long term (current) drug therapy: Secondary | ICD-10-CM | POA: Diagnosis not present

## 2019-08-31 DIAGNOSIS — E119 Type 2 diabetes mellitus without complications: Secondary | ICD-10-CM | POA: Diagnosis not present

## 2019-08-31 DIAGNOSIS — D126 Benign neoplasm of colon, unspecified: Secondary | ICD-10-CM | POA: Diagnosis not present

## 2019-08-31 HISTORY — PX: COLONOSCOPY WITH PROPOFOL: SHX5780

## 2019-08-31 SURGERY — COLONOSCOPY WITH PROPOFOL
Anesthesia: General

## 2019-08-31 MED ORDER — SODIUM CHLORIDE 0.9 % IV SOLN
INTRAVENOUS | Status: DC
  Administered 2019-08-31: 1000 mL via INTRAVENOUS

## 2019-08-31 MED ORDER — PROPOFOL 500 MG/50ML IV EMUL
INTRAVENOUS | Status: DC | PRN
  Administered 2019-08-31: 160 ug/kg/min via INTRAVENOUS

## 2019-08-31 MED ORDER — PROPOFOL 10 MG/ML IV BOLUS
INTRAVENOUS | Status: DC | PRN
  Administered 2019-08-31: 60 mg via INTRAVENOUS

## 2019-08-31 NOTE — H&P (Signed)
Outpatient short stay form Pre-procedure 08/31/2019 8:40 AM Lollie Sails MD  Primary Physician: Dr. Fulton Reek  Reason for visit: Colonoscopy  History of present illness: Patient is a 68 year old male presenting today for colonoscopy in regards to colon cancer screening.  His last colonoscopy was about 10 years ago on 12/30/2008.  There were no polyps at that time.  Patient does take Plavix daily and has held that for over 5 days.  Takes no other blood thinning agent with the exception of 81 mg aspirin.  He tolerated his prep well.  Takes no other aspirin product or blood thinning agent.    Current Facility-Administered Medications:  .  0.9 %  sodium chloride infusion, , Intravenous, Continuous, Lollie Sails, MD, Last Rate: 20 mL/hr at 08/31/19 0821, 1,000 mL at 08/31/19 [id number]  Medications Prior to Admission  Medication Sig Dispense Refill Last Dose  . aspirin EC 81 MG tablet Take 81 mg by mouth daily.   Past Week at Unknown time  . atorvastatin (LIPITOR) 40 MG tablet Take 1 tablet (40 mg total) by mouth daily at 6 PM. 30 tablet 0 Past Week at Unknown time  . canagliflozin (INVOKANA) 300 MG TABS tablet Take 1 tablet by mouth daily.   Past Week at Unknown time  . clopidogrel (PLAVIX) 75 MG tablet Take 1 tablet (75 mg total) by mouth daily with breakfast. 30 tablet 0 Past Week at Unknown time  . metFORMIN (GLUCOPHAGE) 1000 MG tablet Take 1 tablet by mouth daily.   Past Week at Unknown time  . nitroGLYCERIN (NITROSTAT) 0.4 MG SL tablet Place 1 tablet (0.4 mg total) under the tongue every 5 (five) minutes as needed for chest pain. 30 tablet 0   . pantoprazole (PROTONIX) 40 MG tablet Take 40 mg by mouth 2 (two) times daily as needed.   Not Taking at Unknown time     No Known Allergies   Past Medical History:  Diagnosis Date  . Diabetes mellitus without complication ([place])     Review of systems:      Physical Exam    Heart and lungs: Regular rate and rhythm without rub or  gallop lungs are bilaterally clear    HEENT: Normocephalic atraumatic eyes are anicteric    Other:    Pertinant exam for procedure: Soft nontender nondistended bowel sounds positive normoactive    Planned proceedures:  I have discussed the risks benefits and complications of procedures to include not limited to bleeding, infection, perforation and the risk of sedation and the patient wishes to proceed.Colonoscopy and indicated procedures.    Lollie Sails, MD Gastroenterology 08/31/2019  8:40 AM

## 2019-08-31 NOTE — Op Note (Signed)
[facility] Gastroenterology Patient Name: Chris Harris Procedure Date: 08/31/2019 8:51 AM MRN: [id number] Account #: [id number] Date of Birth: [birth year] Admit Type: Outpatient Age: 68 Room: [facility] ENDO ROOM 2 Gender: Male Note Status: Finalized Procedure:            Colonoscopy Indications:          Screening for colorectal malignant neoplasm Providers:            Lollie Sails, MD Referring MD:         Leonie Douglas. Doy Hutching, MD (Referring MD) Medicines:            Monitored Anesthesia Care Complications:        No immediate complications. Procedure:            Pre-Anesthesia Assessment:                       - ASA Grade Assessment: III - A patient with severe                        systemic disease.                       After obtaining informed consent, the colonoscope was                        passed under direct vision. Throughout the procedure,                        the patient's blood pressure, pulse, and oxygen                        saturations were monitored continuously. The                        Colonoscope was introduced through the anus and                        advanced to the the cecum, identified by appendiceal                        orifice and ileocecal valve. The colonoscopy was                        performed without difficulty. The patient tolerated the                        procedure well. The quality of the bowel preparation                        was good. Findings:      Multiple medium-mouthed diverticula were found in the sigmoid colon,       descending colon, transverse colon and ascending colon.      A 3 mm polyp was found in the appendiceal orifice. The polyp was       sessile. The polyp was removed with a cold biopsy forceps. Resection and       retrieval were complete.      A 2 mm polyp was found in the distal ileocecal valve. The polyp was       sessile. The polyp was removed with a cold biopsy forceps. Resection and  retrieval were complete.      Non-bleeding internal hemorrhoids were found during retroflexion and       during anoscopy. The hemorrhoids were small and Grade I (internal       hemorrhoids that do not prolapse).      No additional abnormalities were found on retroflexion. Impression:           - Diverticulosis in the sigmoid colon, in the                        descending colon, in the transverse colon and in the                        ascending colon.                       - One 3 mm polyp at the appendiceal orifice, removed                        with a cold biopsy forceps. Resected and retrieved.                       - One 2 mm polyp at the distal ileocecal valve, removed                        with a cold biopsy forceps. Resected and retrieved.                       - Non-bleeding internal hemorrhoids. Recommendation:       - Discharge patient to home.                       - Telephone GI clinic for pathology results in 5 days. Procedure Code(s):    --- Professional ---                       [phone], Colonoscopy, flexible; with biopsy, single or                        multiple Diagnosis Code(s):    --- Professional ---                       Z12.11, Encounter for screening for malignant neoplasm                        of colon                       K64.0, First degree hemorrhoids                       K63.5, Polyp of colon                       K57.30, Diverticulosis of large intestine without                        perforation or abscess without bleeding CPT copyright 2019 [employer]. All rights reserved. The codes documented in this report are preliminary and upon coder review may  be revised to meet current compliance requirements. Lollie Sails, MD 08/31/2019 9:23:12 AM This report has been  signed electronically. Number of Addenda: 0 Note Initiated On: 08/31/2019 8:51 AM Scope Withdrawal Time: 0 hours 13 minutes 19 seconds  Total Procedure Duration: 0  hours 21 minutes 29 seconds       [facility]

## 2019-08-31 NOTE — Anesthesia Post-op Follow-up Note (Signed)
Anesthesia QCDR form completed.        

## 2019-08-31 NOTE — Anesthesia Preprocedure Evaluation (Signed)
Anesthesia Evaluation  Patient identified by MRN, date of birth, ID band Patient awake    Reviewed: Allergy & Precautions, NPO status , Patient's Chart, lab work & pertinent test results  History of Anesthesia Complications Negative for: history of anesthetic complications  Airway Mallampati: II  TM Distance: >3 FB Neck ROM: Full    Dental  (+) Missing   Pulmonary neg pulmonary ROS, neg sleep apnea, neg COPD,    breath sounds clear to auscultation- rhonchi (-) wheezing      Cardiovascular Exercise Tolerance: Good (-) hypertension(-) angina+ CAD and + Cardiac Stents  (-) Past MI and (-) CABG  Rhythm:Regular Rate:Normal - Systolic murmurs and - Diastolic murmurs    Neuro/Psych neg Seizures negative neurological ROS  negative psych ROS   GI/Hepatic negative GI ROS, Neg liver ROS,   Endo/Other  diabetes, Oral Hypoglycemic Agents  Renal/GU negative Renal ROS     Musculoskeletal negative musculoskeletal ROS (+)   Abdominal (+) - obese,   Peds  Hematology negative hematology ROS (+)   Anesthesia Other Findings Past Medical History: No date: Diabetes mellitus without complication (HCC)   Reproductive/Obstetrics                             Anesthesia Physical Anesthesia Plan  ASA: III  Anesthesia Plan: General   Post-op Pain Management:    Induction: Intravenous  PONV Risk Score and Plan: 1 and Propofol infusion  Airway Management Planned: Natural Airway  Additional Equipment:   Intra-op Plan:   Post-operative Plan:   Informed Consent: I have reviewed the patients History and Physical, chart, labs and discussed the procedure including the risks, benefits and alternatives for the proposed anesthesia with the patient or authorized representative who has indicated his/her understanding and acceptance.     Dental advisory given  Plan Discussed with: CRNA and  Anesthesiologist  Anesthesia Plan Comments:         Anesthesia Quick Evaluation

## 2019-08-31 NOTE — Anesthesia Procedure Notes (Signed)
Performed by: Wadie Mattie, CRNA Pre-anesthesia Checklist: Patient identified, Emergency Drugs available, Patient being monitored, Suction available and Timeout performed Patient Re-evaluated:Patient Re-evaluated prior to induction Oxygen Delivery Method: Nasal cannula Induction Type: IV induction       

## 2019-08-31 NOTE — Anesthesia Postprocedure Evaluation (Signed)
Anesthesia Post Note  Patient: Chris Harris  Procedure(s) Performed: COLONOSCOPY WITH PROPOFOL (N/A )  Patient location during evaluation: Endoscopy Anesthesia Type: General Level of consciousness: awake and alert and oriented Pain management: pain level controlled Vital Signs Assessment: post-procedure vital signs reviewed and stable Respiratory status: spontaneous breathing, nonlabored ventilation and respiratory function stable Cardiovascular status: blood pressure returned to baseline and stable Postop Assessment: no signs of nausea or vomiting Anesthetic complications: no     Last Vitals:  Vitals:   08/31/19 0940 08/31/19 0950  BP: 129/72 130/72  Pulse: 61 (!) 50  Resp: 19 18  Temp:    SpO2: 99% 100%    Last Pain:  Vitals:   08/31/19 0920  TempSrc: Tympanic  PainSc:                  [place]

## 2019-08-31 NOTE — Transfer of Care (Signed)
Immediate Anesthesia Transfer of Care Note  Patient: Chris Harris  Procedure(s) Performed: COLONOSCOPY WITH PROPOFOL (N/A )  Patient Location: PACU  Anesthesia Type:General  Level of Consciousness: awake and alert   Airway & Oxygen Therapy: Patient Spontanous Breathing and Patient connected to nasal cannula oxygen  Post-op Assessment: Report given to RN and Post -op Vital signs reviewed and stable  Post vital signs: Reviewed and stable  Last Vitals:  Vitals Value Taken Time  BP    Temp    Pulse 67 08/31/19 0935  Resp 25 08/31/19 0935  SpO2 96 % 08/31/19 0935  Vitals shown include unvalidated device data.  Last Pain:  Vitals:   08/31/19 0920  TempSrc: Tympanic  PainSc:          Complications: No apparent anesthesia complications

## 2019-09-01 ENCOUNTER — Encounter: Payer: Self-pay | Admitting: Gastroenterology

## 2019-09-01 LAB — SURGICAL PATHOLOGY

## 2019-12-15 DIAGNOSIS — E785 Hyperlipidemia, unspecified: Secondary | ICD-10-CM | POA: Diagnosis not present

## 2019-12-15 DIAGNOSIS — Z0000 Encounter for general adult medical examination without abnormal findings: Secondary | ICD-10-CM | POA: Diagnosis not present

## 2019-12-15 DIAGNOSIS — I2510 Atherosclerotic heart disease of native coronary artery without angina pectoris: Secondary | ICD-10-CM | POA: Diagnosis not present

## 2019-12-15 DIAGNOSIS — E119 Type 2 diabetes mellitus without complications: Secondary | ICD-10-CM | POA: Diagnosis not present

## 2019-12-15 DIAGNOSIS — I10 Essential (primary) hypertension: Secondary | ICD-10-CM | POA: Diagnosis not present

## 2019-12-29 DIAGNOSIS — Z9889 Other specified postprocedural states: Secondary | ICD-10-CM | POA: Diagnosis not present

## 2019-12-29 DIAGNOSIS — R079 Chest pain, unspecified: Secondary | ICD-10-CM | POA: Diagnosis not present

## 2019-12-29 DIAGNOSIS — R609 Edema, unspecified: Secondary | ICD-10-CM | POA: Diagnosis not present

## 2019-12-29 DIAGNOSIS — E785 Hyperlipidemia, unspecified: Secondary | ICD-10-CM | POA: Diagnosis not present

## 2019-12-29 DIAGNOSIS — I10 Essential (primary) hypertension: Secondary | ICD-10-CM | POA: Diagnosis not present

## 2020-04-11 DIAGNOSIS — E118 Type 2 diabetes mellitus with unspecified complications: Secondary | ICD-10-CM | POA: Diagnosis not present

## 2020-04-11 DIAGNOSIS — I10 Essential (primary) hypertension: Secondary | ICD-10-CM | POA: Diagnosis not present

## 2020-04-11 DIAGNOSIS — R609 Edema, unspecified: Secondary | ICD-10-CM | POA: Diagnosis not present

## 2020-04-11 DIAGNOSIS — E785 Hyperlipidemia, unspecified: Secondary | ICD-10-CM | POA: Diagnosis not present

## 2020-04-11 DIAGNOSIS — Z9889 Other specified postprocedural states: Secondary | ICD-10-CM | POA: Diagnosis not present

## 2020-04-11 DIAGNOSIS — R079 Chest pain, unspecified: Secondary | ICD-10-CM | POA: Diagnosis not present

## 2020-06-20 DIAGNOSIS — Z125 Encounter for screening for malignant neoplasm of prostate: Secondary | ICD-10-CM | POA: Diagnosis not present

## 2020-06-20 DIAGNOSIS — E119 Type 2 diabetes mellitus without complications: Secondary | ICD-10-CM | POA: Diagnosis not present

## 2020-06-20 DIAGNOSIS — K219 Gastro-esophageal reflux disease without esophagitis: Secondary | ICD-10-CM | POA: Diagnosis not present

## 2020-06-20 DIAGNOSIS — Z79899 Other long term (current) drug therapy: Secondary | ICD-10-CM | POA: Diagnosis not present

## 2020-06-20 DIAGNOSIS — E118 Type 2 diabetes mellitus with unspecified complications: Secondary | ICD-10-CM | POA: Diagnosis not present

## 2020-06-20 DIAGNOSIS — I10 Essential (primary) hypertension: Secondary | ICD-10-CM | POA: Diagnosis not present

## 2020-06-20 DIAGNOSIS — E785 Hyperlipidemia, unspecified: Secondary | ICD-10-CM | POA: Diagnosis not present

## 2020-10-01 DIAGNOSIS — Z03818 Encounter for observation for suspected exposure to other biological agents ruled out: Secondary | ICD-10-CM | POA: Diagnosis not present

## 2020-10-03 DIAGNOSIS — M47814 Spondylosis without myelopathy or radiculopathy, thoracic region: Secondary | ICD-10-CM | POA: Diagnosis not present

## 2020-10-03 DIAGNOSIS — U071 COVID-19: Secondary | ICD-10-CM | POA: Diagnosis not present

## 2020-10-05 DIAGNOSIS — U071 COVID-19: Secondary | ICD-10-CM | POA: Diagnosis not present

## 2020-10-30 DIAGNOSIS — Z23 Encounter for immunization: Secondary | ICD-10-CM | POA: Diagnosis not present

## 2020-10-30 DIAGNOSIS — I10 Essential (primary) hypertension: Secondary | ICD-10-CM | POA: Diagnosis not present

## 2020-10-30 DIAGNOSIS — E118 Type 2 diabetes mellitus with unspecified complications: Secondary | ICD-10-CM | POA: Diagnosis not present

## 2020-10-30 DIAGNOSIS — Z9889 Other specified postprocedural states: Secondary | ICD-10-CM | POA: Diagnosis not present

## 2020-10-30 DIAGNOSIS — R079 Chest pain, unspecified: Secondary | ICD-10-CM | POA: Diagnosis not present

## 2020-12-19 DIAGNOSIS — I208 Other forms of angina pectoris: Secondary | ICD-10-CM | POA: Diagnosis not present

## 2020-12-19 DIAGNOSIS — E118 Type 2 diabetes mellitus with unspecified complications: Secondary | ICD-10-CM | POA: Diagnosis not present

## 2020-12-19 DIAGNOSIS — Z23 Encounter for immunization: Secondary | ICD-10-CM | POA: Diagnosis not present

## 2020-12-19 DIAGNOSIS — E785 Hyperlipidemia, unspecified: Secondary | ICD-10-CM | POA: Diagnosis not present

## 2020-12-19 DIAGNOSIS — R079 Chest pain, unspecified: Secondary | ICD-10-CM | POA: Diagnosis not present

## 2020-12-19 DIAGNOSIS — I10 Essential (primary) hypertension: Secondary | ICD-10-CM | POA: Diagnosis not present

## 2020-12-19 DIAGNOSIS — Z9889 Other specified postprocedural states: Secondary | ICD-10-CM | POA: Diagnosis not present

## 2020-12-22 DIAGNOSIS — R079 Chest pain, unspecified: Secondary | ICD-10-CM | POA: Diagnosis not present

## 2020-12-22 DIAGNOSIS — E118 Type 2 diabetes mellitus with unspecified complications: Secondary | ICD-10-CM | POA: Diagnosis not present

## 2020-12-22 DIAGNOSIS — Z79899 Other long term (current) drug therapy: Secondary | ICD-10-CM | POA: Diagnosis not present

## 2020-12-22 DIAGNOSIS — I10 Essential (primary) hypertension: Secondary | ICD-10-CM | POA: Diagnosis not present

## 2020-12-22 DIAGNOSIS — E785 Hyperlipidemia, unspecified: Secondary | ICD-10-CM | POA: Diagnosis not present

## 2020-12-22 DIAGNOSIS — Z0000 Encounter for general adult medical examination without abnormal findings: Secondary | ICD-10-CM | POA: Diagnosis not present

## 2020-12-27 DIAGNOSIS — R079 Chest pain, unspecified: Secondary | ICD-10-CM | POA: Diagnosis not present

## 2020-12-27 DIAGNOSIS — I208 Other forms of angina pectoris: Secondary | ICD-10-CM | POA: Diagnosis not present

## 2020-12-27 DIAGNOSIS — Z9889 Other specified postprocedural states: Secondary | ICD-10-CM | POA: Diagnosis not present

## 2021-01-01 DIAGNOSIS — Z23 Encounter for immunization: Secondary | ICD-10-CM | POA: Diagnosis not present

## 2021-01-01 DIAGNOSIS — I10 Essential (primary) hypertension: Secondary | ICD-10-CM | POA: Diagnosis not present

## 2021-01-01 DIAGNOSIS — I208 Other forms of angina pectoris: Secondary | ICD-10-CM | POA: Diagnosis not present

## 2021-01-01 DIAGNOSIS — E785 Hyperlipidemia, unspecified: Secondary | ICD-10-CM | POA: Diagnosis not present

## 2021-01-05 DIAGNOSIS — E118 Type 2 diabetes mellitus with unspecified complications: Secondary | ICD-10-CM | POA: Diagnosis not present

## 2021-01-05 DIAGNOSIS — Z79899 Other long term (current) drug therapy: Secondary | ICD-10-CM | POA: Diagnosis not present

## 2021-01-05 DIAGNOSIS — E785 Hyperlipidemia, unspecified: Secondary | ICD-10-CM | POA: Diagnosis not present

## 2021-01-05 DIAGNOSIS — I10 Essential (primary) hypertension: Secondary | ICD-10-CM | POA: Diagnosis not present

## 2021-04-09 DIAGNOSIS — E118 Type 2 diabetes mellitus with unspecified complications: Secondary | ICD-10-CM | POA: Diagnosis not present

## 2021-04-09 DIAGNOSIS — R0789 Other chest pain: Secondary | ICD-10-CM | POA: Diagnosis not present

## 2021-04-09 DIAGNOSIS — I10 Essential (primary) hypertension: Secondary | ICD-10-CM | POA: Diagnosis not present

## 2021-04-09 DIAGNOSIS — E785 Hyperlipidemia, unspecified: Secondary | ICD-10-CM | POA: Diagnosis not present

## 2021-04-09 DIAGNOSIS — R7309 Other abnormal glucose: Secondary | ICD-10-CM | POA: Diagnosis not present

## 2021-04-09 DIAGNOSIS — I878 Other specified disorders of veins: Secondary | ICD-10-CM | POA: Diagnosis not present

## 2021-07-11 DIAGNOSIS — I10 Essential (primary) hypertension: Secondary | ICD-10-CM | POA: Diagnosis not present

## 2021-07-11 DIAGNOSIS — E785 Hyperlipidemia, unspecified: Secondary | ICD-10-CM | POA: Diagnosis not present

## 2021-07-11 DIAGNOSIS — R9430 Abnormal result of cardiovascular function study, unspecified: Secondary | ICD-10-CM | POA: Diagnosis not present

## 2021-11-14 DIAGNOSIS — E119 Type 2 diabetes mellitus without complications: Secondary | ICD-10-CM | POA: Diagnosis not present

## 2021-11-14 DIAGNOSIS — I10 Essential (primary) hypertension: Secondary | ICD-10-CM | POA: Diagnosis not present

## 2021-11-14 DIAGNOSIS — Z125 Encounter for screening for malignant neoplasm of prostate: Secondary | ICD-10-CM | POA: Diagnosis not present

## 2021-11-14 DIAGNOSIS — Z79899 Other long term (current) drug therapy: Secondary | ICD-10-CM | POA: Diagnosis not present

## 2021-11-14 DIAGNOSIS — E782 Mixed hyperlipidemia: Secondary | ICD-10-CM | POA: Diagnosis not present

## 2021-11-14 DIAGNOSIS — I25118 Atherosclerotic heart disease of native coronary artery with other forms of angina pectoris: Secondary | ICD-10-CM | POA: Diagnosis not present

## 2022-01-11 DIAGNOSIS — E118 Type 2 diabetes mellitus with unspecified complications: Secondary | ICD-10-CM | POA: Diagnosis not present

## 2022-01-11 DIAGNOSIS — I10 Essential (primary) hypertension: Secondary | ICD-10-CM | POA: Diagnosis not present

## 2022-01-11 DIAGNOSIS — Z9889 Other specified postprocedural states: Secondary | ICD-10-CM | POA: Diagnosis not present

## 2022-01-11 DIAGNOSIS — E782 Mixed hyperlipidemia: Secondary | ICD-10-CM | POA: Diagnosis not present

## 2022-01-11 DIAGNOSIS — I25118 Atherosclerotic heart disease of native coronary artery with other forms of angina pectoris: Secondary | ICD-10-CM | POA: Diagnosis not present

## 2022-01-11 DIAGNOSIS — R079 Chest pain, unspecified: Secondary | ICD-10-CM | POA: Diagnosis not present

## 2022-01-11 DIAGNOSIS — R609 Edema, unspecified: Secondary | ICD-10-CM | POA: Diagnosis not present

## 2022-04-01 DIAGNOSIS — E118 Type 2 diabetes mellitus with unspecified complications: Secondary | ICD-10-CM | POA: Diagnosis not present

## 2022-04-01 DIAGNOSIS — E782 Mixed hyperlipidemia: Secondary | ICD-10-CM | POA: Diagnosis not present

## 2022-04-01 DIAGNOSIS — Z79899 Other long term (current) drug therapy: Secondary | ICD-10-CM | POA: Diagnosis not present

## 2022-05-20 DIAGNOSIS — E785 Hyperlipidemia, unspecified: Secondary | ICD-10-CM | POA: Diagnosis not present

## 2022-05-20 DIAGNOSIS — Z0000 Encounter for general adult medical examination without abnormal findings: Secondary | ICD-10-CM | POA: Diagnosis not present

## 2022-05-20 DIAGNOSIS — E119 Type 2 diabetes mellitus without complications: Secondary | ICD-10-CM | POA: Diagnosis not present

## 2022-05-20 DIAGNOSIS — I10 Essential (primary) hypertension: Secondary | ICD-10-CM | POA: Diagnosis not present

## 2022-05-20 DIAGNOSIS — Z79899 Other long term (current) drug therapy: Secondary | ICD-10-CM | POA: Diagnosis not present

## 2022-05-20 DIAGNOSIS — I2510 Atherosclerotic heart disease of native coronary artery without angina pectoris: Secondary | ICD-10-CM | POA: Diagnosis not present

## 2022-08-02 DIAGNOSIS — Z9889 Other specified postprocedural states: Secondary | ICD-10-CM | POA: Diagnosis not present

## 2022-08-02 DIAGNOSIS — E782 Mixed hyperlipidemia: Secondary | ICD-10-CM | POA: Diagnosis not present

## 2022-08-02 DIAGNOSIS — E118 Type 2 diabetes mellitus with unspecified complications: Secondary | ICD-10-CM | POA: Diagnosis not present

## 2022-08-02 DIAGNOSIS — I25118 Atherosclerotic heart disease of native coronary artery with other forms of angina pectoris: Secondary | ICD-10-CM | POA: Diagnosis not present

## 2022-08-02 DIAGNOSIS — I10 Essential (primary) hypertension: Secondary | ICD-10-CM | POA: Diagnosis not present

## 2022-08-02 DIAGNOSIS — R609 Edema, unspecified: Secondary | ICD-10-CM | POA: Diagnosis not present

## 2022-08-02 DIAGNOSIS — Z79899 Other long term (current) drug therapy: Secondary | ICD-10-CM | POA: Diagnosis not present

## 2022-08-08 DIAGNOSIS — H66001 Acute suppurative otitis media without spontaneous rupture of ear drum, right ear: Secondary | ICD-10-CM | POA: Diagnosis not present

## 2022-08-08 DIAGNOSIS — B9689 Other specified bacterial agents as the cause of diseases classified elsewhere: Secondary | ICD-10-CM | POA: Diagnosis not present

## 2022-08-08 DIAGNOSIS — J019 Acute sinusitis, unspecified: Secondary | ICD-10-CM | POA: Diagnosis not present

## 2022-08-08 DIAGNOSIS — J029 Acute pharyngitis, unspecified: Secondary | ICD-10-CM | POA: Diagnosis not present

## 2022-12-30 DIAGNOSIS — Z125 Encounter for screening for malignant neoplasm of prostate: Secondary | ICD-10-CM | POA: Diagnosis not present

## 2022-12-30 DIAGNOSIS — I10 Essential (primary) hypertension: Secondary | ICD-10-CM | POA: Diagnosis not present

## 2022-12-30 DIAGNOSIS — E782 Mixed hyperlipidemia: Secondary | ICD-10-CM | POA: Diagnosis not present

## 2022-12-30 DIAGNOSIS — E118 Type 2 diabetes mellitus with unspecified complications: Secondary | ICD-10-CM | POA: Diagnosis not present

## 2022-12-30 DIAGNOSIS — Z79899 Other long term (current) drug therapy: Secondary | ICD-10-CM | POA: Diagnosis not present

## 2022-12-30 DIAGNOSIS — I25118 Atherosclerotic heart disease of native coronary artery with other forms of angina pectoris: Secondary | ICD-10-CM | POA: Diagnosis not present

## 2023-01-14 DIAGNOSIS — E118 Type 2 diabetes mellitus with unspecified complications: Secondary | ICD-10-CM | POA: Diagnosis not present

## 2023-01-14 DIAGNOSIS — E782 Mixed hyperlipidemia: Secondary | ICD-10-CM | POA: Diagnosis not present

## 2023-01-14 DIAGNOSIS — R609 Edema, unspecified: Secondary | ICD-10-CM | POA: Diagnosis not present

## 2023-01-14 DIAGNOSIS — I10 Essential (primary) hypertension: Secondary | ICD-10-CM | POA: Diagnosis not present

## 2023-01-14 DIAGNOSIS — Z79899 Other long term (current) drug therapy: Secondary | ICD-10-CM | POA: Diagnosis not present

## 2023-01-14 DIAGNOSIS — I25118 Atherosclerotic heart disease of native coronary artery with other forms of angina pectoris: Secondary | ICD-10-CM | POA: Diagnosis not present

## 2023-01-14 DIAGNOSIS — Z125 Encounter for screening for malignant neoplasm of prostate: Secondary | ICD-10-CM | POA: Diagnosis not present

## 2023-01-14 DIAGNOSIS — Z9889 Other specified postprocedural states: Secondary | ICD-10-CM | POA: Diagnosis not present

## 2023-02-05 DIAGNOSIS — L570 Actinic keratosis: Secondary | ICD-10-CM | POA: Diagnosis not present

## 2023-02-05 DIAGNOSIS — D485 Neoplasm of uncertain behavior of skin: Secondary | ICD-10-CM | POA: Diagnosis not present

## 2023-03-05 DIAGNOSIS — L570 Actinic keratosis: Secondary | ICD-10-CM | POA: Diagnosis not present

## 2023-03-05 DIAGNOSIS — E782 Mixed hyperlipidemia: Secondary | ICD-10-CM | POA: Diagnosis not present

## 2023-03-05 DIAGNOSIS — E119 Type 2 diabetes mellitus without complications: Secondary | ICD-10-CM | POA: Diagnosis not present

## 2023-03-05 DIAGNOSIS — I10 Essential (primary) hypertension: Secondary | ICD-10-CM | POA: Diagnosis not present

## 2023-03-05 DIAGNOSIS — Z79899 Other long term (current) drug therapy: Secondary | ICD-10-CM | POA: Diagnosis not present

## 2023-08-18 DIAGNOSIS — I25118 Atherosclerotic heart disease of native coronary artery with other forms of angina pectoris: Secondary | ICD-10-CM | POA: Diagnosis not present

## 2023-08-18 DIAGNOSIS — Z9889 Other specified postprocedural states: Secondary | ICD-10-CM | POA: Diagnosis not present

## 2023-08-18 DIAGNOSIS — R600 Localized edema: Secondary | ICD-10-CM | POA: Diagnosis not present

## 2023-08-18 DIAGNOSIS — E782 Mixed hyperlipidemia: Secondary | ICD-10-CM | POA: Diagnosis not present

## 2023-08-18 DIAGNOSIS — E118 Type 2 diabetes mellitus with unspecified complications: Secondary | ICD-10-CM | POA: Diagnosis not present

## 2023-09-03 DIAGNOSIS — I10 Essential (primary) hypertension: Secondary | ICD-10-CM | POA: Diagnosis not present

## 2023-09-03 DIAGNOSIS — E118 Type 2 diabetes mellitus with unspecified complications: Secondary | ICD-10-CM | POA: Diagnosis not present

## 2023-09-03 DIAGNOSIS — Z79899 Other long term (current) drug therapy: Secondary | ICD-10-CM | POA: Diagnosis not present

## 2023-09-03 DIAGNOSIS — E782 Mixed hyperlipidemia: Secondary | ICD-10-CM | POA: Diagnosis not present

## 2023-09-10 DIAGNOSIS — E119 Type 2 diabetes mellitus without complications: Secondary | ICD-10-CM | POA: Diagnosis not present

## 2023-09-10 DIAGNOSIS — Z125 Encounter for screening for malignant neoplasm of prostate: Secondary | ICD-10-CM | POA: Diagnosis not present

## 2023-09-10 DIAGNOSIS — I10 Essential (primary) hypertension: Secondary | ICD-10-CM | POA: Diagnosis not present

## 2023-09-10 DIAGNOSIS — Z0000 Encounter for general adult medical examination without abnormal findings: Secondary | ICD-10-CM | POA: Diagnosis not present

## 2023-09-10 DIAGNOSIS — E785 Hyperlipidemia, unspecified: Secondary | ICD-10-CM | POA: Diagnosis not present

## 2023-09-10 DIAGNOSIS — Z79899 Other long term (current) drug therapy: Secondary | ICD-10-CM | POA: Diagnosis not present

## 2023-09-10 DIAGNOSIS — K219 Gastro-esophageal reflux disease without esophagitis: Secondary | ICD-10-CM | POA: Diagnosis not present

## 2024-02-18 DIAGNOSIS — Z23 Encounter for immunization: Secondary | ICD-10-CM | POA: Diagnosis not present

## 2024-02-18 DIAGNOSIS — E782 Mixed hyperlipidemia: Secondary | ICD-10-CM | POA: Diagnosis not present

## 2024-02-18 DIAGNOSIS — I25118 Atherosclerotic heart disease of native coronary artery with other forms of angina pectoris: Secondary | ICD-10-CM | POA: Diagnosis not present

## 2024-02-18 DIAGNOSIS — E118 Type 2 diabetes mellitus with unspecified complications: Secondary | ICD-10-CM | POA: Diagnosis not present

## 2024-02-18 DIAGNOSIS — I10 Essential (primary) hypertension: Secondary | ICD-10-CM | POA: Diagnosis not present

## 2024-03-15 DIAGNOSIS — Z79899 Other long term (current) drug therapy: Secondary | ICD-10-CM | POA: Diagnosis not present

## 2024-03-15 DIAGNOSIS — Z125 Encounter for screening for malignant neoplasm of prostate: Secondary | ICD-10-CM | POA: Diagnosis not present

## 2024-03-15 DIAGNOSIS — I10 Essential (primary) hypertension: Secondary | ICD-10-CM | POA: Diagnosis not present

## 2024-03-15 DIAGNOSIS — E118 Type 2 diabetes mellitus with unspecified complications: Secondary | ICD-10-CM | POA: Diagnosis not present

## 2024-03-15 DIAGNOSIS — E782 Mixed hyperlipidemia: Secondary | ICD-10-CM | POA: Diagnosis not present

## 2024-05-27 DIAGNOSIS — Z1331 Encounter for screening for depression: Secondary | ICD-10-CM | POA: Diagnosis not present

## 2024-05-27 DIAGNOSIS — E119 Type 2 diabetes mellitus without complications: Secondary | ICD-10-CM | POA: Diagnosis not present

## 2024-05-27 DIAGNOSIS — I705 Atherosclerosis of nonautologous biological bypass graft(s) of the extremities: Secondary | ICD-10-CM | POA: Diagnosis not present

## 2024-05-27 DIAGNOSIS — Z0000 Encounter for general adult medical examination without abnormal findings: Secondary | ICD-10-CM | POA: Diagnosis not present

## 2024-05-27 DIAGNOSIS — I2510 Atherosclerotic heart disease of native coronary artery without angina pectoris: Secondary | ICD-10-CM | POA: Diagnosis not present

## 2024-05-27 DIAGNOSIS — E785 Hyperlipidemia, unspecified: Secondary | ICD-10-CM | POA: Diagnosis not present

## 2024-08-02 DIAGNOSIS — E118 Type 2 diabetes mellitus with unspecified complications: Secondary | ICD-10-CM | POA: Diagnosis not present

## 2024-08-20 DIAGNOSIS — R600 Localized edema: Secondary | ICD-10-CM | POA: Diagnosis not present

## 2024-08-20 DIAGNOSIS — I10 Essential (primary) hypertension: Secondary | ICD-10-CM | POA: Diagnosis not present

## 2024-08-20 DIAGNOSIS — E782 Mixed hyperlipidemia: Secondary | ICD-10-CM | POA: Diagnosis not present

## 2024-08-20 DIAGNOSIS — E118 Type 2 diabetes mellitus with unspecified complications: Secondary | ICD-10-CM | POA: Diagnosis not present

## 2024-08-20 DIAGNOSIS — I25118 Atherosclerotic heart disease of native coronary artery with other forms of angina pectoris: Secondary | ICD-10-CM | POA: Diagnosis not present

## 2024-08-20 DIAGNOSIS — R079 Chest pain, unspecified: Secondary | ICD-10-CM | POA: Diagnosis not present
